# Patient Record
Sex: Female | Born: 1980 | Hispanic: Yes | Smoking: Current some day smoker
Health system: Southern US, Community
[De-identification: ages and names within clinical notes are randomized; demographics above are authoritative.]

## PROBLEM LIST (undated history)

## (undated) DIAGNOSIS — D219 Benign neoplasm of connective and other soft tissue, unspecified: Secondary | ICD-10-CM

## (undated) DIAGNOSIS — I10 Essential (primary) hypertension: Secondary | ICD-10-CM

## (undated) DIAGNOSIS — M722 Plantar fascial fibromatosis: Secondary | ICD-10-CM

## (undated) DIAGNOSIS — J45909 Unspecified asthma, uncomplicated: Secondary | ICD-10-CM

## (undated) DIAGNOSIS — E0590 Thyrotoxicosis, unspecified without thyrotoxic crisis or storm: Secondary | ICD-10-CM

## (undated) DIAGNOSIS — F419 Anxiety disorder, unspecified: Secondary | ICD-10-CM

## (undated) DIAGNOSIS — M7730 Calcaneal spur, unspecified foot: Secondary | ICD-10-CM

## (undated) DIAGNOSIS — K219 Gastro-esophageal reflux disease without esophagitis: Secondary | ICD-10-CM

## (undated) DIAGNOSIS — F32A Depression, unspecified: Secondary | ICD-10-CM

## (undated) DIAGNOSIS — G709 Myoneural disorder, unspecified: Secondary | ICD-10-CM

## (undated) HISTORY — PX: MOUTH SURGERY: SHX715

---

## 2003-05-01 ENCOUNTER — Other Ambulatory Visit: Admission: RE | Admit: 2003-05-01 | Discharge: 2003-05-01 | Payer: Self-pay | Admitting: Obstetrics and Gynecology

## 2005-07-23 ENCOUNTER — Encounter: Admission: RE | Admit: 2005-07-23 | Discharge: 2005-07-23 | Payer: Self-pay | Admitting: Family Medicine

## 2006-05-06 ENCOUNTER — Emergency Department (HOSPITAL_COMMUNITY): Admission: EM | Admit: 2006-05-06 | Discharge: 2006-05-06 | Payer: Self-pay | Admitting: Family Medicine

## 2006-12-20 ENCOUNTER — Emergency Department (HOSPITAL_COMMUNITY): Admission: EM | Admit: 2006-12-20 | Discharge: 2006-12-20 | Payer: Self-pay | Admitting: Emergency Medicine

## 2006-12-23 ENCOUNTER — Emergency Department (HOSPITAL_COMMUNITY): Admission: EM | Admit: 2006-12-23 | Discharge: 2006-12-23 | Payer: Self-pay | Admitting: Emergency Medicine

## 2007-05-13 ENCOUNTER — Emergency Department (HOSPITAL_COMMUNITY): Admission: EM | Admit: 2007-05-13 | Discharge: 2007-05-13 | Payer: Self-pay | Admitting: Family Medicine

## 2007-12-19 ENCOUNTER — Emergency Department (HOSPITAL_COMMUNITY): Admission: EM | Admit: 2007-12-19 | Discharge: 2007-12-20 | Payer: Self-pay | Admitting: *Deleted

## 2007-12-31 ENCOUNTER — Emergency Department (HOSPITAL_COMMUNITY): Admission: EM | Admit: 2007-12-31 | Discharge: 2007-12-31 | Payer: Self-pay | Admitting: Emergency Medicine

## 2010-08-22 ENCOUNTER — Encounter: Payer: Self-pay | Admitting: Family Medicine

## 2011-04-27 LAB — D-DIMER, QUANTITATIVE: D-Dimer, Quant: 0.24

## 2011-04-27 LAB — DIFFERENTIAL
Lymphocytes Relative: 25
Lymphs Abs: 1.6
Neutro Abs: 4.6
Neutrophils Relative %: 70

## 2011-04-27 LAB — POCT I-STAT, CHEM 8
BUN: 5 — ABNORMAL LOW
Chloride: 106
Sodium: 140

## 2011-04-27 LAB — CBC
Platelets: 261
WBC: 6.6

## 2011-04-28 LAB — POCT PREGNANCY, URINE
Operator id: 133441
Preg Test, Ur: NEGATIVE

## 2013-02-21 ENCOUNTER — Encounter: Payer: Self-pay | Admitting: Family Medicine

## 2013-02-21 ENCOUNTER — Ambulatory Visit: Payer: Self-pay | Attending: Family Medicine | Admitting: Family Medicine

## 2013-02-21 VITALS — BP 127/90 | HR 96 | Temp 97.7°F | Resp 16

## 2013-02-21 DIAGNOSIS — G8929 Other chronic pain: Secondary | ICD-10-CM | POA: Insufficient documentation

## 2013-02-21 DIAGNOSIS — M5430 Sciatica, unspecified side: Secondary | ICD-10-CM

## 2013-02-21 DIAGNOSIS — R5383 Other fatigue: Secondary | ICD-10-CM

## 2013-02-21 DIAGNOSIS — M25559 Pain in unspecified hip: Secondary | ICD-10-CM | POA: Insufficient documentation

## 2013-02-21 DIAGNOSIS — R631 Polydipsia: Secondary | ICD-10-CM

## 2013-02-21 DIAGNOSIS — R5381 Other malaise: Secondary | ICD-10-CM

## 2013-02-21 DIAGNOSIS — M5440 Lumbago with sciatica, unspecified side: Secondary | ICD-10-CM | POA: Insufficient documentation

## 2013-02-21 LAB — COMPLETE METABOLIC PANEL WITH GFR
ALT: 24 U/L (ref 0–35)
AST: 26 U/L (ref 0–37)
CO2: 24 mEq/L (ref 19–32)
GFR, Est African American: >89 mL/min
Sodium: 133 mEq/L — ABNORMAL LOW (ref 135–145)
Total Bilirubin: 0.5 mg/dL (ref 0.3–1.2)
Total Protein: 7.9 g/dL (ref 6.0–8.3)

## 2013-02-21 LAB — VITAMIN B12: Vitamin B-12: 807 pg/mL (ref 211–911)

## 2013-02-21 LAB — CBC WITH DIFFERENTIAL/PLATELET
Basophils Absolute: 0 10*3/uL (ref 0.0–0.1)
Eosinophils Absolute: 0.1 10*3/uL (ref 0.0–0.7)
Lymphocytes Relative: 35 % (ref 12–46)
Lymphs Abs: 1.8 10*3/uL (ref 0.7–4.0)
MCH: 31.5 pg (ref 26.0–34.0)
Neutrophils Relative %: 53 % (ref 43–77)
Platelets: 335 10*3/uL (ref 150–400)
RBC: 4.48 MIL/uL (ref 3.87–5.11)
RDW: 13 % (ref 11.5–15.5)
WBC: 5.1 10*3/uL (ref 4.0–10.5)

## 2013-02-21 MED ORDER — PREDNISONE 20 MG PO TABS: ORAL_TABLET | ORAL | Status: DC

## 2013-02-21 MED ORDER — TRAMADOL HCL 50 MG PO TABS: 50.0000 mg | ORAL_TABLET | Freq: Three times a day (TID) | ORAL | Status: DC | PRN

## 2013-02-21 NOTE — Patient Instructions (Addendum)
Back Injury Prevention Back injuries can be extremely painful and difficult to heal. After having one back injury, you are much more likely to experience another later on. It is important to learn how to avoid injuring or re-injuring your back. The following tips can help you to prevent a back injury. PHYSICAL FITNESS  Exercise regularly and try to develop good tone in your abdominal muscles. Your abdominal muscles provide a lot of the support needed by your back.  Do aerobic exercises (walking, jogging, biking, swimming) regularly.  Do exercises that increase balance and strength (tai chi, yoga) regularly. This can decrease your risk of falling and injuring your back.  Stretch before and after exercising.  Maintain a healthy weight. The more you weigh, the more stress is placed on your back. For every pound of weight, 10 times that amount of pressure is placed on the back. DIET  Talk to your caregiver about how much calcium and vitamin D you need per day. These nutrients help to prevent weakening of the bones (osteoporosis). Osteoporosis can cause broken (fractured) bones that lead to back pain.  Include good sources of calcium in your diet, such as dairy products, green, leafy vegetables, and products with calcium added (fortified).  Include good sources of vitamin D in your diet, such as milk and foods that are fortified with vitamin D.  Consider taking a nutritional supplement or a multivitamin if needed.  Stop smoking if you smoke. POSTURE  Sit and stand up straight. Avoid leaning forward when you sit or hunching over when you stand.  Choose chairs with good low back (lumbar) support.  If you work at a desk, sit close to your work so you do not need to lean over. Keep your chin tucked in. Keep your neck drawn back and elbows bent at a right angle. Your arms should look like the letter "L."  Sit high and close to the steering wheel when you drive. Add a lumbar support to your car  seat if needed.  Avoid sitting or standing in one position for too long. Take breaks to get up, stretch, and walk around at least once every hour. Take breaks if you are driving for long periods of time.  Sleep on your side with your knees slightly bent, or sleep on your back with a pillow under your knees. Do not sleep on your stomach. LIFTING, TWISTING, AND REACHING  Avoid heavy lifting, especially repetitive lifting. If you must do heavy lifting:  Stretch before lifting.  Work slowly.  Rest between lifts.  Use carts and dollies to move objects when possible.  Make several small trips instead of carrying 1 heavy load.  Ask for help when you need it.  Ask for help when moving big, awkward objects.  Follow these steps when lifting:  Stand with your feet shoulder-width apart.  Get as close to the object as you can. Do not try to pick up heavy objects that are far from your body.  Use handles or lifting straps if they are available.  Bend at your knees. Squat down, but keep your heels off the floor.  Keep your shoulders pulled back, your chin tucked in, and your back straight.  Lift the object slowly, tightening the muscles in your legs, abdomen, and buttocks. Keep the object as close to the center of your body as possible.  When you put a load down, use these same guidelines in reverse.  Do not:  Lift the object above your waist.    Twist at the waist while lifting or carrying a load. Move your feet if you need to turn, not your waist.  Bend over without bending at your knees.  Avoid reaching over your head, across a table, or for an object on a high surface. OTHER TIPS  Avoid wet floors and keep sidewalks clear of ice to prevent falls.  Do not sleep on a mattress that is too soft or too hard.  Keep items that are used frequently within easy reach.  Put heavier objects on shelves at waist level and lighter objects on lower or higher shelves.  Find ways to  decrease your stress, such as exercise, massage, or relaxation techniques. Stress can build up in your muscles. Tense muscles are more vulnerable to injury.  Seek treatment for depression or anxiety if needed. These conditions can increase your risk of developing back pain. SEEK MEDICAL CARE IF:  You injure your back.  You have questions about diet, exercise, or other ways to prevent back injuries. MAKE SURE YOU:  Understand these instructions.  Will watch your condition.  Will get help right away if you are not doing well or get worse. Document Released: 08/25/2004 Document Revised: 10/10/2011 Document Reviewed: 08/29/2011 [facility] Patient Information 2014 [place], [place]. Back Pain, Adult Low back pain is very common. About 1 in 5 people have back pain.The cause of low back pain is rarely dangerous. The pain often gets better over time.About half of people with a sudden onset of back pain feel better in just 2 weeks. About 8 in 10 people feel better by 6 weeks.  CAUSES Some common causes of back pain include:  Strain of the muscles or ligaments supporting the spine.  Wear and tear (degeneration) of the spinal discs.  Arthritis.  Direct injury to the back. DIAGNOSIS Most of the time, the direct cause of low back pain is not known.However, back pain can be treated effectively even when the exact cause of the pain is unknown.Answering your caregiver's questions about your overall health and symptoms is one of the most accurate ways to make sure the cause of your pain is not dangerous. If your caregiver needs more information, he or she may order lab work or imaging tests (X-rays or MRIs).However, even if imaging tests show changes in your back, this usually does not require surgery. HOME CARE INSTRUCTIONS For many people, back pain returns.Since low back pain is rarely dangerous, it is often a condition that people can learn to [facility] their own.   Remain active. It is  stressful on the back to sit or stand in one place. Do not sit, drive, or stand in one place for more than 30 minutes at a time. Take short walks on level surfaces as soon as pain allows.Try to increase the length of time you walk each day.  Do not stay in bed.Resting more than 1 or 2 days can delay your recovery.  Do not avoid exercise or work.Your body is made to move.It is not dangerous to be active, even though your back may hurt.Your back will likely heal faster if you return to being active before your pain is gone.  Pay attention to your body when you bend and lift. Many people have less discomfortwhen lifting if they bend their knees, keep the load close to their bodies,and avoid twisting. Often, the most comfortable positions are those that put less stress on your recovering back.  Find a comfortable position to sleep. Use a firm mattress and lie  on your side with your knees slightly bent. If you lie on your back, put a pillow under your knees.  Only take over-the-counter or prescription medicines as directed by your caregiver. Over-the-counter medicines to reduce pain and inflammation are often the most helpful.Your caregiver may prescribe muscle relaxant drugs.These medicines help dull your pain so you can more quickly return to your normal activities and healthy exercise.  Put ice on the injured area.  Put ice in a plastic bag.  Place a towel between your skin and the bag.  Leave the ice on for 15-20 minutes, 3-4 times a day for the first 2 to 3 days. After that, ice and heat may be alternated to reduce pain and spasms.  Ask your caregiver about trying back exercises and gentle massage. This may be of some benefit.  Avoid feeling anxious or stressed.Stress increases muscle tension and can worsen back pain.It is important to recognize when you are anxious or stressed and learn ways to manage it.Exercise is a great option. SEEK MEDICAL CARE IF:  You have pain that is  not relieved with rest or medicine.  You have pain that does not improve in 1 week.  You have new symptoms.  You are generally not feeling well. SEEK IMMEDIATE MEDICAL CARE IF:   You have pain that radiates from your back into your legs.  You develop new bowel or bladder control problems.  You have unusual weakness or numbness in your arms or legs.  You develop nausea or vomiting.  You develop abdominal pain.  You feel faint. Document Released: 07/18/2005 Document Revised: 01/17/2012 Document Reviewed: 12/06/2010 [facility] Patient Information 2014 [place], [place]. Fatigue Fatigue is a feeling of tiredness, lack of energy, lack of motivation, or feeling tired all the time. Having enough rest, good nutrition, and reducing stress will normally reduce fatigue. Consult your caregiver if it persists. The nature of your fatigue will help your caregiver to find out its cause. The treatment is based on the cause.  CAUSES  There are many causes for fatigue. Most of the time, fatigue can be traced to one or more of your habits or routines. Most causes fit into one or more of three general areas. They are: Lifestyle problems  Sleep disturbances.  Overwork.  Physical exertion.  Unhealthy habits.  Poor eating habits or eating disorders.  Alcohol and/or drug use .  Lack of proper nutrition (malnutrition). Psychological problems  Stress and/or anxiety problems.  Depression.  Grief.  Boredom. Medical Problems or Conditions  Anemia.  Pregnancy.  Thyroid gland problems.  Recovery from major surgery.  Continuous pain.  Emphysema or asthma that is not well controlled  Allergic conditions.  Diabetes.  Infections (such as mononucleosis).  Obesity.  Sleep disorders, such as sleep apnea.  Heart failure or other heart-related problems.  Cancer.  Kidney disease.  Liver disease.  Effects of certain medicines such as antihistamines, cough and cold remedies,  prescription pain medicines, heart and blood pressure medicines, drugs used for treatment of cancer, and some antidepressants. SYMPTOMS  The symptoms of fatigue include:   Lack of energy.  Lack of drive (motivation).  Drowsiness.  Feeling of indifference to the surroundings. DIAGNOSIS  The details of how you feel help guide your caregiver in finding out what is causing the fatigue. You will be asked about your present and past health condition. It is important to review all medicines that you take, including prescription and non-prescription items. A thorough exam will be done. You will be  questioned about your feelings, habits, and normal lifestyle. Your caregiver may suggest blood tests, urine tests, or other tests to look for common medical causes of fatigue.  TREATMENT  Fatigue is treated by correcting the underlying cause. For example, if you have continuous pain or depression, treating these causes will improve how you feel. Similarly, adjusting the dose of certain medicines will help in reducing fatigue.  HOME CARE INSTRUCTIONS   Try to get the required amount of good sleep every night.  Eat a healthy and nutritious diet, and drink enough water throughout the day.  Practice ways of relaxing (including yoga or meditation).  Exercise regularly.  Make plans to change situations that cause stress. Act on those plans so that stresses decrease over time. Keep your work and personal routine reasonable.  Avoid street drugs and minimize use of alcohol.  Start taking a daily multivitamin after consulting your caregiver. SEEK MEDICAL CARE IF:   You have persistent tiredness, which cannot be accounted for.  You have fever.  You have unintentional weight loss.  You have headaches.  You have disturbed sleep throughout the night.  You are feeling sad.  You have constipation.  You have dry skin.  You have gained weight.  You are taking any new or different medicines that you  suspect are causing fatigue.  You are unable to sleep at night.  You develop any unusual swelling of your legs or other parts of your body. SEEK IMMEDIATE MEDICAL CARE IF:   You are feeling confused.  Your vision is blurred.  You feel faint or pass out.  You develop severe headache.  You develop severe abdominal, pelvic, or back pain.  You develop chest pain, shortness of breath, or an irregular or fast heartbeat.  You are unable to pass a normal amount of urine.  You develop abnormal bleeding such as bleeding from the rectum or you vomit blood.  You have thoughts about harming yourself or committing suicide.  You are worried that you might harm someone else. MAKE SURE YOU:   Understand these instructions.  Will watch your condition.  Will get help right away if you are not doing well or get worse. Document Released: 05/15/2007 Document Revised: 10/10/2011 Document Reviewed: 05/15/2007 [facility] Patient Information 2014 [place], [place].

## 2013-02-21 NOTE — Progress Notes (Signed)
Patient here for right hip pain Has been feeling fatuqed Increase thirst

## 2013-02-21 NOTE — Progress Notes (Signed)
Patient ID: Alyssa Sullivan, female   DOB: May 27, 1981, 32 y.o.   MRN: [id number]  CC:  Chief Complaint  Patient presents with  . Hip Pain    right   HPI: Pt reports having chronic right hip pain.  She says that she was told years ago that it was coming from lower back.  Pain involves right hip and occasionally shoots down to right leg.  Pain has gotten worse over past few weeks.  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  Pt says no injury associated with pain. No fall and no trauma that she can remember.  Pt says that she is getting concerned about it now because of worsening symptoms.  Pt says that she has not had any imaging studies done up to this time.  No loss of bowel or bladder control.  No rash.  Pt reports that she has been having chronic fatigue for past several months and is really concerned about having it evaluated.   Allergies  Allergen Reactions  . Codeine   . Penicillins    No past medical history on file. No current outpatient prescriptions on file prior to visit.   No current facility-administered medications on file prior to visit.   No family history on file. History   Social History  . Marital Status: Single    Spouse Name: N/A    Number of Children: 0  . Years of Education: 24   Occupational History  . waitress    Social History Main Topics  . Smoking status: Current Every Day Smoker    Types: Cigarettes  . Smokeless tobacco: Not on file  . Alcohol Use: 0.0 oz/week  . Drug Use: No  . Sexually Active: Not on file   Other Topics Concern  . Not on file   Social History Narrative  . No narrative on file    Review of Systems  Constitutional: Positive for chronic fatigue. Negative for fever, chills, diaphoresis, activity change, appetite change.  HENT: Negative for ear pain, nosebleeds, congestion, facial swelling, rhinorrhea, neck pain, neck stiffness and ear discharge.   Eyes: Negative for pain, discharge, redness, itching  and visual disturbance.  Respiratory: Negative for cough, choking, chest tightness, shortness of breath, wheezing and stridor.   Cardiovascular: Negative for chest pain, palpitations and leg swelling.  Gastrointestinal: Negative for abdominal distention.  Genitourinary: Negative for dysuria, urgency, frequency, hematuria, flank pain, decreased urine volume, difficulty urinating and dyspareunia.  Musculoskeletal: chronic right hip joint pain.  Neurological: Negative for dizziness, tremors, seizures, syncope, facial asymmetry, speech difficulty, weakness, light-headedness, numbness and headaches.  Hematological: Negative for adenopathy. Does not bruise/bleed easily.  Psychiatric/Behavioral: Negative for hallucinations, behavioral problems, confusion, dysphoric mood, decreased concentration and agitation.    Objective:   Filed Vitals:   02/21/13 1522  BP: 127/90  Pulse: 96  Temp: 97.7 F (36.5 C)  Resp: 16    Physical Exam  Constitutional: Appears well-developed and well-nourished. No distress.  HENT: Normocephalic. External right and left ear normal. Oropharynx is clear and moist.  Eyes: Conjunctivae and EOM are normal. PERRLA, no scleral icterus.  Neck: Normal ROM. Neck supple. No JVD. No tracheal deviation. No thyromegaly.  CVS: RRR, S1/S2 +, no murmurs, no gallops, no carotid bruit.  Pulmonary: Effort and breath sounds normal, no stridor, rhonchi, wheezes, rales.  Abdominal: Soft. BS +,  no distension, tenderness, rebound or guarding.  Musculoskeletal: significant pain in right hip joint with abduction of right  leg and internal rotation of hip joint.  Lymphadenopathy: No lymphadenopathy noted, cervical, inguinal. Neuro: Alert. Normal reflexes, muscle tone coordination. No cranial nerve deficit. Skin: Skin is warm and dry. No rash noted. Not diaphoretic. No erythema. No pallor.  Psychiatric: Normal mood and affect. Behavior, judgment, thought content normal.   Lab Results   Component Value Date   WBC 6.6 12/19/2007   HGB 16.0* 12/19/2007   HCT 47.0* 12/19/2007   MCV 95.1 12/19/2007   PLT 261 12/19/2007   Lab Results  Component Value Date   CREATININE 1.0 12/19/2007   BUN 5* 12/19/2007   NA 140 12/19/2007   K 4.6 12/19/2007   CL 106 12/19/2007    No results found for this basename: HGBA1C   Lipid Panel  No results found for this basename: chol, trig, hdl, cholhdl, vldl, ldlcalc       Assessment and plan:   Patient Active Problem List   Diagnosis Date Noted  . Chronic hip pain 02/21/2013  . Low back pain with sciatica 02/21/2013   Check xrays of lumbar spine and right hip - sent to [facility].   Refer to sports medicine for evaluation and mgmt  Rx for prednisone 50 mg po daily #5  Tramadol 50 mg po every 8 hours prn severe pain, #25  Follow up in 3 weeks  For fatigue, check labs today  The patient was counseled on the dangers of tobacco use, and was advised to quit.  Reviewed strategies to maximize success, including removing cigarettes and smoking materials from environment.   The patient was given clear instructions to go to ER or return to medical center if symptoms don't improve, worsen or new problems develop.  The patient verbalized understanding.  The patient was told to call to get any lab results if not heard anything in the next week.    Rodney Langton, MD, CDE, FAAFP [facility] [place], [place]   Results for orders placed in visit on 02/21/13  GLUCOSE, POCT (MANUAL RESULT ENTRY)      Result Value Range   POC Glucose 103 (*) 70 - 99 mg/dl

## 2013-02-22 ENCOUNTER — Telehealth: Payer: Self-pay | Admitting: *Deleted

## 2013-02-22 LAB — VITAMIN D 25 HYDROXY (VIT D DEFICIENCY, FRACTURES): Vit D, 25-Hydroxy: 21 ng/mL — ABNORMAL LOW (ref 30–89)

## 2013-02-22 NOTE — Telephone Encounter (Signed)
02/22/13 Patient made aware of lab results vitamin D mildly low  Recommend over the counter Vitamin D 400-800 daily. Thyroid and metabolic panel  Okay. P.Demani Mcbrien,RN BSN [facility]

## 2013-02-22 NOTE — Progress Notes (Signed)
Quick Note:  Please inform patient that labs came back OK except that Vit D came back mildy low. Recommend patient take over the counter vitamin D 400-800 IU caps po daily. Metabolic panel, thyroid, B12 and blood counts came back normal.   Rodney Langton, MD, CDE, FAAFP [facility] [place], [place]   ______

## 2013-02-27 ENCOUNTER — Ambulatory Visit: Payer: Self-pay

## 2013-03-14 ENCOUNTER — Ambulatory Visit: Payer: Self-pay

## 2013-03-20 ENCOUNTER — Ambulatory Visit: Payer: Self-pay | Admitting: Sports Medicine

## 2014-03-13 ENCOUNTER — Emergency Department (INDEPENDENT_AMBULATORY_CARE_PROVIDER_SITE_OTHER)
Admission: EM | Admit: 2014-03-13 | Discharge: 2014-03-13 | Disposition: A | Discharge status: Home / Self Care | Payer: Self-pay | Source: Home / Self Care | Attending: Emergency Medicine | Admitting: Emergency Medicine

## 2014-03-13 ENCOUNTER — Encounter (HOSPITAL_COMMUNITY): Payer: Self-pay | Admitting: Emergency Medicine

## 2014-03-13 DIAGNOSIS — J020 Streptococcal pharyngitis: Secondary | ICD-10-CM

## 2014-03-13 LAB — POCT RAPID STREP A: Streptococcus, Group A Screen (Direct): POSITIVE — AB

## 2014-03-13 MED ORDER — AZITHROMYCIN 500 MG PO TABS: 500.0000 mg | ORAL_TABLET | Freq: Every day | ORAL | Status: DC

## 2014-03-13 MED ORDER — METHYLPREDNISOLONE ACETATE 80 MG/ML IJ SUSP
INTRAMUSCULAR | Status: AC
  Filled 2014-03-13: qty 1

## 2014-03-13 MED ORDER — PREDNISONE 20 MG PO TABS: 20.0000 mg | ORAL_TABLET | Freq: Two times a day (BID) | ORAL | Status: DC

## 2014-03-13 MED ORDER — METHYLPREDNISOLONE ACETATE 80 MG/ML IJ SUSP
80.0000 mg | Freq: Once | INTRAMUSCULAR | Status: AC
  Administered 2014-03-13: 80 mg via INTRAMUSCULAR

## 2014-03-13 NOTE — Discharge Instructions (Signed)

## 2014-03-13 NOTE — ED Notes (Signed)
C/o sore throat  States she has laryngitis and is unable to talk OTC meds used as tx

## 2014-03-13 NOTE — ED Provider Notes (Signed)
Chief Complaint   Chief Complaint  Patient presents with  . Sore Throat    History of Present Illness   Alyssa Sullivan is a 33 year old female who has had a four-day history of sore throat, pain on swallowing, chills, headache, aching in her neck, pain on the entire right side of her head including her eye in her ear, abdominal pain, nausea, and vomiting.   Review of Systems   Other than as noted above, the patient denies any of the following symptoms. Systemic:  No fever, chills, sweats, myalgias, or headache. Eye:  No redness, pain or drainage. ENT:  No earache, nasal congestion, sneezing, rhinorrhea, sinus pressure, sinus pain, or post nasal drip. Lungs:  No cough, sputum production, wheezing, shortness of breath, or chest pain. GI:  No abdominal pain, nausea, vomiting, or diarrhea. Skin:  No rash.  [place]   Past medical history, family history, social history, meds, and allergies were reviewed. She's allergic to penicillin and codeine.  Physical Exam     Vital signs:  BP 127/87  Pulse 86  Temp(Src) 98.4 F (36.9 C) (Oral)  Resp 16  SpO2 99%  LMP 02/20/2014 General:  Alert, in no distress. Phonation was normal, no drooling, and patient was able to handle secretions well.  Eye:  No conjunctival injection or drainage. Lids were normal. ENT:  TMs and canals were normal, without erythema or inflammation.  Nasal mucosa was clear and uncongested, without drainage.  Mucous membranes were moist.  Exam of pharynx tonsils were symmetrically enlarged and erythematous without any exudate.  There were no oral ulcerations or lesions. There was no bulging of the tonsillar pillars, and the uvula was midline. Neck:  Supple, no adenopathy, tenderness or mass. Lungs:  No respiratory distress.  Lungs were clear to auscultation, without wheezes, rales or rhonchi.  Breath sounds were clear and equal bilaterally.  Heart:  Regular rhythm, without gallops, murmers or rubs. Skin:  Clear,  warm, and dry, without rash or lesions.  Labs   Results for orders placed during the hospital encounter of 03/13/14  POCT RAPID STREP A ([facility] URG CARE ONLY)      Result Value Ref Range   Streptococcus, Group A Screen (Direct) POSITIVE (*) NEGATIVE    Course in Urgent [place]   The patient was given the following meds: Medications  methylPREDNISolone acetate (DEPO-MEDROL) injection 80 mg (80 mg Intramuscular Given 03/13/14 1440)   Assessment   The encounter diagnosis was Strep throat.  There is no evidence of a peritonsillar abscess, retropharyngeal abscess, or epiglottitis.    Plan     1.  Meds:  The following meds were prescribed:   Discharge Medication List as of 03/13/2014  2:21 PM    START taking these medications   Details  azithromycin (ZITHROMAX) 500 MG tablet Take 1 tablet (500 mg total) by mouth daily., Starting 03/13/2014, Until Discontinued, Normal    !! predniSONE (DELTASONE) 20 MG tablet Take 1 tablet (20 mg total) by mouth 2 (two) times daily., Starting 03/13/2014, Until Discontinued, Normal     !! - Potential duplicate medications found. Please discuss with provider.      2.  Patient Education/Counseling:  The patient was given appropriate handouts, self care instructions, and instructed in symptomatic relief, including hot saline gargles, throat lozenges, infectious precautions, and need to trade out toothbrush.    3.  Follow up:  The patient was told to follow up here if no better in 3 to 4 days, or sooner if  becoming worse in any way, and given some red flag symptoms such as difficulty swallowing or breathing which would prompt immediate return.      Harden Mo, MD 03/13/14 [phone]

## 2014-05-27 ENCOUNTER — Emergency Department (INDEPENDENT_AMBULATORY_CARE_PROVIDER_SITE_OTHER)
Admission: EM | Admit: 2014-05-27 | Discharge: 2014-05-27 | Disposition: A | Discharge status: Home / Self Care | Payer: Self-pay | Source: Home / Self Care | Attending: Family Medicine | Admitting: Family Medicine

## 2014-05-27 ENCOUNTER — Encounter (HOSPITAL_COMMUNITY): Payer: Self-pay | Admitting: Emergency Medicine

## 2014-05-27 DIAGNOSIS — F17200 Nicotine dependence, unspecified, uncomplicated: Secondary | ICD-10-CM

## 2014-05-27 DIAGNOSIS — Z720 Tobacco use: Secondary | ICD-10-CM

## 2014-05-27 DIAGNOSIS — J40 Bronchitis, not specified as acute or chronic: Secondary | ICD-10-CM

## 2014-05-27 MED ORDER — PREDNISONE 50 MG PO TABS: 50.0000 mg | ORAL_TABLET | Freq: Every day | ORAL | Status: DC

## 2014-05-27 MED ORDER — IPRATROPIUM-ALBUTEROL 0.5-2.5 (3) MG/3ML IN SOLN
RESPIRATORY_TRACT | Status: AC
  Filled 2014-05-27: qty 3

## 2014-05-27 MED ORDER — ALBUTEROL SULFATE HFA 108 (90 BASE) MCG/ACT IN AERS
INHALATION_SPRAY | RESPIRATORY_TRACT | Status: AC
  Filled 2014-05-27: qty 6.7

## 2014-05-27 MED ORDER — ALBUTEROL SULFATE HFA 108 (90 BASE) MCG/ACT IN AERS
2.0000 | INHALATION_SPRAY | Freq: Four times a day (QID) | RESPIRATORY_TRACT | Status: DC | PRN
  Administered 2014-05-27: 2 via RESPIRATORY_TRACT

## 2014-05-27 MED ORDER — IPRATROPIUM-ALBUTEROL 0.5-2.5 (3) MG/3ML IN SOLN
3.0000 mL | Freq: Once | RESPIRATORY_TRACT | Status: AC
  Administered 2014-05-27: 3 mL via RESPIRATORY_TRACT

## 2014-05-27 NOTE — ED Notes (Signed)
C/o  Chest tightness and chest congestion.  Productive cough with thick yellow/green sputum.  Wheezing.  Sob.  Symptoms worse at night.  No fever, n/v.  Mild loose stools.  Symptoms present x 2 wks.    No relief with otc meds.

## 2014-05-27 NOTE — ED Provider Notes (Signed)
Alyssa Sullivan is a 33 y.o. female who presents to Urgent Care today for shortness of breath productive cough and wheezing. Symptoms present for about 2 weeks. No fevers or chills nausea vomiting or diarrhea. Patient is a current daily smoker. She has tried over-the-counter medications which have not helped.   History reviewed. No pertinent past medical history. History  Substance Use Topics  . Smoking status: Current Every Day Smoker    Types: Cigarettes  . Smokeless tobacco: Not on file  . Alcohol Use: 0.0 oz/week   ROS as above Medications: Current Facility-Administered Medications  Medication Dose Route Frequency Provider Last Rate Last Dose  . albuterol (PROVENTIL HFA;VENTOLIN HFA) 108 (90 BASE) MCG/ACT inhaler 2 puff  2 puff Inhalation Q6H PRN Gregor Hams, MD   2 puff at 05/27/14 1656   Current Outpatient Prescriptions  Medication Sig Dispense Refill  . predniSONE (DELTASONE) 50 MG tablet Take 1 tablet (50 mg total) by mouth daily.  5 tablet  0  . VITAMIN D, CHOLECALCIFEROL, PO Take by mouth.        Exam:  BP 130/81  Pulse 105  Temp(Src) 98.2 F (36.8 C) (Oral)  Resp 16  SpO2 97%  LMP 05/18/2014 Gen: Well NAD HEENT: EOMI,  MMM Lungs: Normal work of breathing. Prolonged expiratory phase with wheezing and coarse breath sounds present bilaterally. Heart: RRR no MRG Abd: NABS, Soft. Nondistended, Nontender Exts: Brisk capillary refill, warm and well perfused.   She was given a 2.5/0.5 mg DuoNeb nebulizer treatment, and felt better  No results found for this or any previous visit (from the past 24 hour(s)). No results found.  Assessment and Plan: 33 y.o. female with bronchitis likely related to smoking. Possible underlying COPD. We do treat with albuterol and prednisone. Follow up with primary care is needed. Quit smoking.  Discussed warning signs or symptoms. Please see discharge instructions. Patient expresses understanding.     Gregor Hams, MD 05/27/14  [phone]

## 2014-05-27 NOTE — Discharge Instructions (Signed)
Thank you for coming in today. Take prednisone daily for 5 days. Use albuterol as needed for wheezing. Please quit smoking Call or go to the emergency room if you get worse, have trouble breathing, have chest pains, or palpitations.    Chronic Obstructive Pulmonary Disease Chronic obstructive pulmonary disease (COPD) is a common lung condition in which airflow from the lungs is limited. COPD is a general term that can be used to describe many different lung problems that limit airflow, including both chronic bronchitis and emphysema. If you have COPD, your lung function will probably never return to normal, but there are measures you can take to improve lung function and make yourself feel better.  CAUSES   Smoking (common).   Exposure to secondhand smoke.   Genetic problems.  Chronic inflammatory lung diseases or recurrent infections. SYMPTOMS   Shortness of breath, especially with physical activity.   Deep, persistent (chronic) cough with a large amount of thick mucus.   Wheezing.   Rapid breaths (tachypnea).   Gray or bluish discoloration (cyanosis) of the skin, especially in fingers, toes, or lips.   Fatigue.   Weight loss.   Frequent infections or episodes when breathing symptoms become much worse (exacerbations).   Chest tightness. DIAGNOSIS  Your health care provider will take a medical history and perform a physical examination to make the initial diagnosis. Additional tests for COPD may include:   Lung (pulmonary) function tests.  Chest X-ray.  CT scan.  Blood tests. TREATMENT  Treatment available to help you feel better when you have COPD includes:   Inhaler and nebulizer medicines. These help manage the symptoms of COPD and make your breathing more comfortable.  Supplemental oxygen. Supplemental oxygen is only helpful if you have a low oxygen level in your blood.   Exercise and physical activity. These are beneficial for nearly all people  with COPD. Some people may also benefit from a pulmonary rehabilitation program. HOME CARE INSTRUCTIONS   Take all medicines (inhaled or pills) as directed by your health care provider.  Avoid over-the-counter medicines or cough syrups that dry up your airway (such as antihistamines) and slow down the elimination of secretions unless instructed otherwise by your health care provider.   If you are a smoker, the most important thing that you can do is stop smoking. Continuing to smoke will cause further lung damage and breathing trouble. Ask your health care provider for help with quitting smoking. He or she can direct you to community resources or hospitals that provide support.  Avoid exposure to irritants such as smoke, chemicals, and fumes that aggravate your breathing.  Use oxygen therapy and pulmonary rehabilitation if directed by your health care provider. If you require home oxygen therapy, ask your health care provider whether you should purchase a pulse oximeter to measure your oxygen level at home.   Avoid contact with individuals who have a contagious illness.  Avoid extreme temperature and humidity changes.  Eat healthy foods. Eating smaller, more frequent meals and resting before meals may help you maintain your strength.  Stay active, but balance activity with periods of rest. Exercise and physical activity will help you maintain your ability to do things you want to do.  Preventing infection and hospitalization is very important when you have COPD. Make sure to receive all the vaccines your health care provider recommends, especially the pneumococcal and influenza vaccines. Ask your health care provider whether you need a pneumonia vaccine.  Learn and use relaxation techniques to manage  stress.  Learn and use controlled breathing techniques as directed by your health care provider. Controlled breathing techniques include:   Pursed lip breathing. Start by breathing in  (inhaling) through your nose for 1 second. Then, purse your lips as if you were going to whistle and breathe out (exhale) through the pursed lips for 2 seconds.   Diaphragmatic breathing. Start by putting one hand on your abdomen just above your waist. Inhale slowly through your nose. The hand on your abdomen should move out. Then purse your lips and exhale slowly. You should be able to feel the hand on your abdomen moving in as you exhale.   Learn and use controlled coughing to clear mucus from your lungs. Controlled coughing is a series of short, progressive coughs. The steps of controlled coughing are:  1. Lean your head slightly forward.  2. Breathe in deeply using diaphragmatic breathing.  3. Try to hold your breath for 3 seconds.  4. Keep your mouth slightly open while coughing twice.  5. Spit any mucus out into a tissue.  6. Rest and repeat the steps once or twice as needed. SEEK MEDICAL CARE IF:   You are coughing up more mucus than usual.   There is a change in the color or thickness of your mucus.   Your breathing is more labored than usual.   Your breathing is faster than usual.  SEEK IMMEDIATE MEDICAL CARE IF:   You have shortness of breath while you are resting.   You have shortness of breath that prevents you from:  Being able to talk.   Performing your usual physical activities.   You have chest pain lasting longer than 5 minutes.   Your skin color is more cyanotic than usual.  You measure low oxygen saturations for longer than 5 minutes with a pulse oximeter. MAKE SURE YOU:   Understand these instructions.  Will watch your condition.  Will get help right away if you are not doing well or get worse. Document Released: 04/27/2005 Document Revised: 12/02/2013 Document Reviewed: 03/14/2013 [facility] Patient Information 2015 [place], [place]. This information is not intended to replace advice given to you by your health care provider. Make sure you  discuss any questions you have with your health care provider.

## 2014-05-29 ENCOUNTER — Telehealth (HOSPITAL_COMMUNITY): Payer: Self-pay | Admitting: Family Medicine

## 2014-05-29 MED ORDER — TRAMADOL HCL 50 MG PO TABS: 50.0000 mg | ORAL_TABLET | Freq: Every evening | ORAL | Status: DC | PRN

## 2014-05-29 NOTE — ED Notes (Signed)
Patient continues to cough and requests cough medication. We'll prescribe tramadol  Gregor Hams, MD 05/29/14 [phone]

## 2014-05-30 NOTE — ED Notes (Signed)
Pt called  Inquiring  About  Rx     -   Got  An  Answering  Machine      Message  Left  For  Pt to call  back

## 2014-06-02 ENCOUNTER — Telehealth (HOSPITAL_COMMUNITY): Payer: Self-pay | Admitting: Emergency Medicine

## 2014-06-02 MED ORDER — TRAMADOL HCL 50 MG PO TABS
100.0000 mg | ORAL_TABLET | Freq: Three times a day (TID) | ORAL | Status: DC | PRN
Start: 2014-06-02 — End: 2015-09-10

## 2014-06-02 NOTE — ED Notes (Signed)
Dr Jake Michaelis agreed to write script for patient.

## 2014-07-14 ENCOUNTER — Emergency Department (INDEPENDENT_AMBULATORY_CARE_PROVIDER_SITE_OTHER)
Admission: EM | Admit: 2014-07-14 | Discharge: 2014-07-14 | Disposition: A | Discharge status: Home / Self Care | Payer: Self-pay | Source: Home / Self Care | Attending: Family Medicine | Admitting: Family Medicine

## 2014-07-14 ENCOUNTER — Encounter (HOSPITAL_COMMUNITY): Payer: Self-pay | Admitting: Emergency Medicine

## 2014-07-14 DIAGNOSIS — J36 Peritonsillar abscess: Secondary | ICD-10-CM

## 2014-07-14 NOTE — ED Notes (Signed)
Pt states that she has had a sore throat for 3 months with new onset of hoarseness

## 2014-07-14 NOTE — ED Provider Notes (Signed)
Alyssa Sullivan is a 33 y.o. female who presents to Urgent Care today for sore throat cough and voice change. Patient was seen in our clinic in late October for coughing congestion. She was prescribed prednisone and tramadol for cough medication. Since the last several months she's continued to have a mild cough which tramadol has not helped much. However on Friday she developed worsening sore throat, voice change and difficulty opening her mouth. Symptoms have worsened. She's tried Chloraseptic Spray and ginger tea which have not helped. No vomiting or diarrhea.   No past medical history on file. No past surgical history on file. History  Substance Use Topics  . Smoking status: Current Every Day Smoker    Types: Cigarettes  . Smokeless tobacco: Not on file  . Alcohol Use: 0.0 oz/week   ROS as above Medications: No current facility-administered medications for this encounter.   Current Outpatient Prescriptions  Medication Sig Dispense Refill  . predniSONE (DELTASONE) 50 MG tablet Take 1 tablet (50 mg total) by mouth daily. 5 tablet 0  . traMADol (ULTRAM) 50 MG tablet Take 1 tablet (50 mg total) by mouth at bedtime as needed (cough). 10 tablet 0  . traMADol (ULTRAM) 50 MG tablet Take 2 tablets (100 mg total) by mouth every 8 (eight) hours as needed. 30 tablet 0  . VITAMIN D, CHOLECALCIFEROL, PO Take by mouth.     Allergies  Allergen Reactions  . Codeine   . Penicillins      Exam:  BP 124/91 mmHg  Pulse 108  Temp(Src) 98.9 F (37.2 C) (Oral)  Resp 16  SpO2 100% Gen: Well NAD HEENT: EOMI,  MMM trismus present. Left midline shift of uvula to the right. Muffled voice Lungs: Normal work of breathing. CTABL Heart: RRR no MRG Abd: NABS, Soft. Nondistended, Nontender Exts: Brisk capillary refill, warm and well perfused.   No results found for this or any previous visit (from the past 24 hour(s)). No results found.  Assessment and Plan: 33 y.o. female with concern for  peritonsillar abscess on the left side. Discussed with on-call ENT Dr. Simeon Craft. Patient will have directly to [facility] now for evaluation and management.  Discussed warning signs or symptoms. Please see discharge instructions. Patient expresses understanding.     Gregor Hams, MD 07/14/14 [phone]

## 2014-07-14 NOTE — Discharge Instructions (Signed)
Thank you for coming in today. Go directly to [facility] and see Dr. Simeon Craft.

## 2014-10-25 ENCOUNTER — Emergency Department (HOSPITAL_COMMUNITY)
Admission: EM | Admit: 2014-10-25 | Discharge: 2014-10-25 | Disposition: A | Discharge status: Home / Self Care | Payer: Self-pay | Attending: Emergency Medicine | Admitting: Emergency Medicine

## 2014-10-25 ENCOUNTER — Emergency Department (HOSPITAL_COMMUNITY)
Admission: EM | Admit: 2014-10-25 | Discharge: 2014-10-25 | Discharge status: Against Medical Advice | Payer: Self-pay | Attending: Emergency Medicine | Admitting: Emergency Medicine

## 2014-10-25 ENCOUNTER — Emergency Department (HOSPITAL_COMMUNITY): Payer: Self-pay

## 2014-10-25 ENCOUNTER — Encounter (HOSPITAL_COMMUNITY): Payer: Self-pay | Admitting: Emergency Medicine

## 2014-10-25 ENCOUNTER — Encounter (HOSPITAL_COMMUNITY): Payer: Self-pay

## 2014-10-25 DIAGNOSIS — J45909 Unspecified asthma, uncomplicated: Secondary | ICD-10-CM | POA: Insufficient documentation

## 2014-10-25 DIAGNOSIS — R05 Cough: Secondary | ICD-10-CM | POA: Insufficient documentation

## 2014-10-25 DIAGNOSIS — Z720 Tobacco use: Secondary | ICD-10-CM | POA: Insufficient documentation

## 2014-10-25 DIAGNOSIS — K088 Other specified disorders of teeth and supporting structures: Secondary | ICD-10-CM | POA: Insufficient documentation

## 2014-10-25 DIAGNOSIS — Z8739 Personal history of other diseases of the musculoskeletal system and connective tissue: Secondary | ICD-10-CM | POA: Insufficient documentation

## 2014-10-25 DIAGNOSIS — Z7952 Long term (current) use of systemic steroids: Secondary | ICD-10-CM | POA: Insufficient documentation

## 2014-10-25 DIAGNOSIS — Z79899 Other long term (current) drug therapy: Secondary | ICD-10-CM | POA: Insufficient documentation

## 2014-10-25 DIAGNOSIS — Z880 Allergy status to penicillin: Secondary | ICD-10-CM | POA: Insufficient documentation

## 2014-10-25 DIAGNOSIS — R059 Cough, unspecified: Secondary | ICD-10-CM

## 2014-10-25 DIAGNOSIS — R1110 Vomiting, unspecified: Secondary | ICD-10-CM | POA: Insufficient documentation

## 2014-10-25 DIAGNOSIS — Z791 Long term (current) use of non-steroidal anti-inflammatories (NSAID): Secondary | ICD-10-CM | POA: Insufficient documentation

## 2014-10-25 DIAGNOSIS — K0889 Other specified disorders of teeth and supporting structures: Secondary | ICD-10-CM

## 2014-10-25 HISTORY — DX: Plantar fascial fibromatosis: M72.2

## 2014-10-25 HISTORY — DX: Calcaneal spur, unspecified foot: M77.30

## 2014-10-25 HISTORY — DX: Unspecified asthma, uncomplicated: J45.909

## 2014-10-25 LAB — COMPREHENSIVE METABOLIC PANEL
ALK PHOS: 59 U/L (ref 39–117)
ALT: 28 U/L (ref 0–35)
ANION GAP: 8 (ref 5–15)
AST: 30 U/L (ref 0–37)
Albumin: 3.5 g/dL (ref 3.5–5.2)
BILIRUBIN TOTAL: 0.9 mg/dL (ref 0.3–1.2)
BUN: 7 mg/dL (ref 6–23)
CALCIUM: 8.7 mg/dL (ref 8.4–10.5)
CO2: 24 mmol/L (ref 19–32)
Chloride: 100 mmol/L (ref 96–112)
Creatinine, Ser: 0.75 mg/dL (ref 0.50–1.10)
GLUCOSE: 114 mg/dL — AB (ref 70–99)
POTASSIUM: 3.9 mmol/L (ref 3.5–5.1)
Sodium: 132 mmol/L — ABNORMAL LOW (ref 135–145)
Total Protein: 7.6 g/dL (ref 6.0–8.3)

## 2014-10-25 LAB — CBC WITH DIFFERENTIAL/PLATELET
BASOS ABS: 0 10*3/uL (ref 0.0–0.1)
BASOS PCT: 0 % (ref 0–1)
Eosinophils Absolute: 0.1 10*3/uL (ref 0.0–0.7)
Eosinophils Relative: 1 % (ref 0–5)
HEMATOCRIT: 41.9 % (ref 36.0–46.0)
Hemoglobin: 14.1 g/dL (ref 12.0–15.0)
LYMPHS ABS: 2 10*3/uL (ref 0.7–4.0)
Lymphocytes Relative: 23 % (ref 12–46)
MCH: 31 pg (ref 26.0–34.0)
MCHC: 33.7 g/dL (ref 30.0–36.0)
MCV: 92.1 fL (ref 78.0–100.0)
MONO ABS: 0.6 10*3/uL (ref 0.1–1.0)
MONOS PCT: 7 % (ref 3–12)
NEUTROS PCT: 69 % (ref 43–77)
Neutro Abs: 5.8 10*3/uL (ref 1.7–7.7)
PLATELETS: 278 10*3/uL (ref 150–400)
RBC: 4.55 MIL/uL (ref 3.87–5.11)
RDW: 13.7 % (ref 11.5–15.5)
WBC: 8.4 10*3/uL (ref 4.0–10.5)

## 2014-10-25 MED ORDER — TRAMADOL HCL 50 MG PO TABS: 50.0000 mg | ORAL_TABLET | Freq: Four times a day (QID) | ORAL | Status: DC | PRN

## 2014-10-25 MED ORDER — ONDANSETRON HCL 4 MG PO TABS
4.0000 mg | ORAL_TABLET | Freq: Once | ORAL | Status: AC
  Administered 2014-10-25: 4 mg via ORAL
  Filled 2014-10-25: qty 1

## 2014-10-25 MED ORDER — OXYCODONE-ACETAMINOPHEN 5-325 MG PO TABS
2.0000 | ORAL_TABLET | Freq: Once | ORAL | Status: AC
  Administered 2014-10-25: 2 via ORAL
  Filled 2014-10-25: qty 2

## 2014-10-25 MED ORDER — BUPIVACAINE HCL 0.5 % IJ SOLN
50.0000 mL | Freq: Once | INTRAMUSCULAR | Status: AC
  Administered 2014-10-25: 50 mL
  Filled 2014-10-25: qty 50

## 2014-10-25 NOTE — Discharge Instructions (Signed)
Cough, Adult  A cough is a reflex that helps clear your throat and airways. It can help heal the body or may be a reaction to an irritated airway. A cough may only last 2 or 3 weeks (acute) or may last more than 8 weeks (chronic).  CAUSES Acute cough:  Viral or bacterial infections. Chronic cough:  Infections.  Allergies.  Asthma.  Post-nasal drip.  Smoking.  Heartburn or acid reflux.  Some medicines.  Chronic lung problems (COPD).  Cancer. SYMPTOMS   Cough.  Fever.  Chest pain.  Increased breathing rate.  High-pitched whistling sound when breathing (wheezing).  Colored mucus that you cough up (sputum). TREATMENT   A bacterial cough may be treated with antibiotic medicine.  A viral cough must run its course and will not respond to antibiotics.  Your caregiver may recommend other treatments if you have a chronic cough. HOME CARE INSTRUCTIONS   Only take over-the-counter or prescription medicines for pain, discomfort, or fever as directed by your caregiver. Use cough suppressants only as directed by your caregiver.  Use a cold steam vaporizer or humidifier in your bedroom or home to help loosen secretions.  Sleep in a semi-upright position if your cough is worse at night.  Rest as needed.  Stop smoking if you smoke. SEEK IMMEDIATE MEDICAL CARE IF:   You have pus in your sputum.  Your cough starts to worsen.  You cannot control your cough with suppressants and are losing sleep.  You begin coughing up blood.  You have difficulty breathing.  You develop pain which is getting worse or is uncontrolled with medicine.  You have a fever. MAKE SURE YOU:   Understand these instructions.  Will watch your condition.  Will get help right away if you are not doing well or get worse. Document Released: 01/14/2011 Document Revised: 10/10/2011 Document Reviewed: 01/14/2011 [facility] Patient Information 2015 [place], [place]. This information is not intended  to replace advice given to you by your health care provider. Make sure you discuss any questions you have with your health care provider.  Dental Pain A tooth ache may be caused by cavities (tooth decay). Cavities expose the nerve of the tooth to air and hot or cold temperatures. It may come from an infection or abscess (also called a boil or furuncle) around your tooth. It is also often caused by dental caries (tooth decay). This causes the pain you are having. DIAGNOSIS  Your caregiver can diagnose this problem by exam. TREATMENT   If caused by an infection, it may be treated with medications which kill germs (antibiotics) and pain medications as prescribed by your caregiver. Take medications as directed.  Only take over-the-counter or prescription medicines for pain, discomfort, or fever as directed by your caregiver.  Whether the tooth ache today is caused by infection or dental disease, you should see your dentist as soon as possible for further care. SEEK MEDICAL CARE IF: The exam and treatment you received today has been provided on an emergency basis only. This is not a substitute for complete medical or dental care. If your problem worsens or new problems (symptoms) appear, and you are unable to meet with your dentist, call or return to this location. SEEK IMMEDIATE MEDICAL CARE IF:   You have a fever.  You develop redness and swelling of your face, jaw, or neck.  You are unable to open your mouth.  You have severe pain uncontrolled by pain medicine. MAKE SURE YOU:   Understand these instructions.  Will watch your condition.  Will get help right away if you are not doing well or get worse. Document Released: 07/18/2005 Document Revised: 10/10/2011 Document Reviewed: 03/05/2008 [facility] Patient Information 2015 [place], [place]. This information is not intended to replace advice given to you by your health care provider. Make sure you discuss any questions you have with your  health care provider.   Emergency Department Resource Guide 1) Find a Doctor and Pay Out of Pocket Although you won't have to find out who is covered by your insurance plan, it is a good idea to ask around and get recommendations. You will then need to call the office and see if the doctor you have chosen will accept you as a new patient and what types of options they offer for patients who are self-pay. Some doctors offer discounts or will set up payment plans for their patients who do not have insurance, but you will need to ask so you aren't surprised when you get to your appointment.  2) Contact Your Local Health Department Not all health departments have doctors that can see patients for sick visits, but many do, so it is worth a call to see if yours does. If you don't know where your local health department is, you can check in your phone book. The CDC also has a tool to help you locate your state's health department, and many state websites also have listings of all of their local health departments.  3) Find a [facility] If your illness is not likely to be very severe or complicated, you may want to try a walk in clinic. These are popping up all over the country in pharmacies, drugstores, and shopping centers. They're usually staffed by nurse practitioners or physician assistants that have been trained to treat common illnesses and complaints. They're usually fairly quick and inexpensive. However, if you have serious medical issues or chronic medical problems, these are probably not your best option.  No Primary Care Doctor: - Call Health Connect at  [phone] - they can help you locate a primary care doctor that  accepts your insurance, provides certain services, etc. - Physician Referral Service- [phone]  Chronic Pain Problems: Organization         Address  Phone   Notes  [facility]  [phone] Patients need to be referred by their primary care doctor.    Medication Assistance: Organization         Address  Phone   Notes  [facility] Medication [facility] [address] [place], [place] [address] [phone] --Must be a resident of [facility] -- Must have NO insurance coverage whatsoever (no Medicaid/ Medicare, etc.) -- The pt. MUST have a primary care doctor that directs their care regularly and follows them in the community   MedAssist  [phone]   [employer]  [phone]    Agencies that provide inexpensive medical care: Organization         Address  Phone   Notes  [place]  [phone]   [facility]    [phone]   [facility] [place], [place] [address] [phone]   [place] [address], [place] [phone]   Planned Parenthood    [phone]   [facility]    [phone]   [place] [place],  [place] Phone:  [phone], Fax:  [phone] Hours of Operation:  9 am - 6 pm, M-F.  Also accepts Medicaid/Medicare and self-pay.  [facility] for [place], [address], [place] Phone: [phone], Fax: [phone]. Hours of Operation:  8:30 am - 5:30 pm, M-F.  Also accepts Medicaid and self-pay.  [facility] [place] [address], [place] Phone: [phone]   [place], [place], [place] [phone], Ext. [phone] Mondays & Thursdays: 7-9 AM.  First 15 patients are seen on a first come, first serve basis.    [place] Providers:  Organization         Address  Phone   Notes  [facility] [address], [place] [phone] Also accepts self-pay patients.  [facility] [address], [place]  [phone]   [place], [address], [place] [phone]   [facility] [address] [place] [phone]   Lucianne Lei [address], [place]   [phone] Only accepts [place] Access [place] patients after they have their name applied to their card.   Self-Pay (no insurance) in [facility]:  Organization         Address  Phone   Notes  Sickle Cell Patients, [facility] [place] [phone]   [facility] [place] [phone]   [facility] [place]  [place], [place], [place] [phone]   [facility]/Dr. Osei-Bonsu  [address], [place] or [address], [place] [phone] Phone number for both [place] and [place] locations is the same.  Urgent Medical and [facility] [address] [place] [phone]   [facility] [address] [place] or [address] [phone]   [facility] [address] [place] [phone], phone; [phone], fax Sees patients 1st and 3rd Saturday of every month.  Must not qualify for public or private insurance (i.e. Medicaid, Medicare, [place] Health Choice, Veterans' Benefits)  Household income should be no more than 200% of the poverty level The clinic cannot treat you if you are pregnant or think you are pregnant  Sexually transmitted diseases are not treated at the clinic.    Dental Care: Organization         Address  Phone  Notes  [facility] Department of [facility] [place] [phone] Accepts children up to age 36 who are enrolled in [place] or [place]; pregnant women with a Medicaid card; and children who have applied for Medicaid or  Health Choice, but were declined, whose parents can pay a reduced fee at time of service.   [facility] Department of [facility]  [address], [place] [phone] Accepts children up to age 33 who are enrolled in [place] or [place]; pregnant women with a Medicaid card; and children who have applied for Medicaid or  Health Choice, but were declined, whose parents can pay a reduced fee at time of service.  [place] [facility] Access PROGRAM  [place] [phone] Patients are seen by  appointment only. Walk-ins are not accepted. [place] will see patients 49 years of age and older. Monday - Tuesday (8am-5pm) Most Wednesdays (8:30-5pm) $30 per visit, cash only  [facility] Adult Dental Access PROGRAM  [address], [facility] [phone] Patients are seen by appointment only. Walk-ins are not accepted. [place] will see patients 48 years of age and older. One Wednesday Evening (Monthly: Volunteer Based).  $30 per visit, cash only  [place]  [phone] for adults; Children under age 38, call Graduate Pediatric Dentistry at [phone]. Children aged 37-14, please call [phone] to request a pediatric application.  Dental services are provided in all areas of dental care including fillings, crowns and bridges, complete and partial dentures, implants, gum treatment, root canals, and extractions. Preventive care is also provided. Treatment is provided to both adults and children. Patients are selected via a lottery and there is often a waiting list.   [facility] [address], [place]  [phone] [email or web address]   [facility] [address] [place], [place] [phone], Ext. [phone] Second and Fourth Thursday of each month, opens at 6:30 AM; Clinic ends at 9 AM.  Patients are seen on a first-come first-served basis, and a limited number are seen during each clinic.   [facility]  [address] [place], [place] [phone]   Eligibility Requirements You must have lived in [place], [place], or [place] counties for at least the last three months.   You cannot be eligible for state or federal sponsored [employer], including [employer], [place], or [employer].   You generally cannot be eligible for healthcare insurance through your employer.    How to apply: Eligibility screenings are held every Tuesday and Wednesday afternoon from 1:00 pm until 4:00 pm. You do not need an appointment for the interview!  [facility] [address] [place], [place]   [place]  [place] Department  [place]  [phone]    Behavioral Health Resources in the Community: Intensive Outpatient Programs Organization         Address  Phone  Notes  [place]. [address], [place], [place] [phone]   [facility] [address] [place], [place]   ADS: Alcohol & Drug Svcs [address], [place], [place]   [place] [address],  [place], [place] or [phone]   Substance Abuse Resources Organization         Address  Phone  Notes  Alcohol and Drug Services  [phone]   [place]  [phone]   [facility] [place]   Chinita Pester  [phone]   Residential & Outpatient Substance Abuse Program  [phone]   Psychological Services Organization         Address  Phone  Notes  [facility] [place]  [place]  [phone]   [place] [address] [place] [phone] or [phone]    Mobile Crisis Teams Organization         Address  Phone  Notes  [facility], Mobile Crisis Care Unit  [phone]   [facility]  [address] [place], [place]   [facility] [address], [place] [phone]  Self-Help/Support Groups Organization         Address  Phone             Notes  Mental Health Assoc. of [place] - variety of support groups  [place] Call for more information  Narcotics Anonymous (NA), [facility] [address], [employer] [place]  2 meetings at this location   Special educational needs teacher         Address  Phone  Notes  [facility] [place],    [place]  [phone]   [facility]  [address] [place] [address]35701, [place], [place]   [place], [place] [phone] Admissions: 8am-3pm M-F  Incentives Substance [place] [address]    [place], [place] [phone]   [facility] [address] [place], [place], [place]   [facility] [address]  [place], [place]   [facility] - Intensive Outpatient [address], [place], [place]   [facility] ([place].,  [place], [place] [phone] or [phone]   [facility] (RTS) [address] [place], [place] Accepts Medicaid  [facility] [place] [address],  [place] [phone] Substance Abuse/Addiction Treatment   [facility] Organization         Address  Phone  Notes  [facility]  [phone]   Domenic Schwab, PhD [address] [place], [place]   [phone] or [phone]   [place], [place] [phone]   [facility] [address], [place], [place] [phone] Insurance/Medicaid/sponsorship through [facility] and Families [address], [address] [place]                                    [place], [place] [phone]  [place] [address], [place] [phone]    Dr. Adele Schilder  [phone]   [facility] Dept. 1) [address], [place] 2) [place] 3)  [address], [place] [phone]  [phone]   [place] [phone] or [phone] (After Hours)

## 2014-10-25 NOTE — ED Notes (Signed)
Pt c/o right lower toothache ongoing for 1 month. Pt also reports productive cough ongoing "for a long time."  Pt talking in complete sentences without difficulty.

## 2014-10-25 NOTE — ED Notes (Addendum)
Pt presents with c/o dental pain and vomiting. Pt reports that both the dental pain and the vomiting started yesterday. Pt just left [facility] because she said her girlfriend was hungry and is diabetic and she needed to leave and get her something to eat.

## 2014-10-29 NOTE — ED Provider Notes (Signed)
CSN: [id number]     Arrival date & time 10/25/14  49 History   First MD Initiated Contact with Patient 10/25/14 1640     Chief Complaint  Patient presents with  . Dental Pain  . Emesis     (Consider location/radiation/quality/duration/timing/severity/associated sxs/prior Treatment) HPI  34yF with dental pain. Gradual onset about one month ago. Right lower tooth. Worse over the last few days. No drainage. No fevers or chills. No facial swelling. No difficulty breathing or swallowing. Has not had dental appointment for this yet. Nausea. No abdominal pain. No acute respiratory complaints aside from a nonproductive cough. No fevers or chills.  Past Medical History  Diagnosis Date  . Asthma   . Heel spur   . Plantar fasciitis    Past Surgical History  Procedure Laterality Date  . Mouth surgery     No family history on file. History  Substance Use Topics  . Smoking status: Current Every Day Smoker    Types: Cigarettes  . Smokeless tobacco: Not on file  . Alcohol Use: 0.0 oz/week     Comment:  wine daily   OB History    No data available     Review of Systems  All systems reviewed and negative, other than as noted in HPI.   Allergies  Codeine and Penicillins  Home Medications   Prior to Admission medications   Medication Sig Start Date End Date Taking? Authorizing Provider  albuterol (PROVENTIL HFA;VENTOLIN HFA) 108 (90 BASE) MCG/ACT inhaler Inhale 2 puffs into the lungs every 6 (six) hours as needed for wheezing or shortness of breath.   Yes Historical Provider, MD  naproxen sodium (ANAPROX) 220 MG tablet Take 660 mg by mouth 2 (two) times daily as needed (pain).   Yes Historical Provider, MD  predniSONE (DELTASONE) 50 MG tablet Take 1 tablet (50 mg total) by mouth daily. Patient not taking: Reported on 10/25/2014 05/27/14   Gregor Hams, MD  traMADol (ULTRAM) 50 MG tablet Take 1 tablet (50 mg total) by mouth at bedtime as needed (cough). Patient not taking: Reported  on 10/25/2014 05/29/14   Gregor Hams, MD  traMADol (ULTRAM) 50 MG tablet Take 2 tablets (100 mg total) by mouth every 8 (eight) hours as needed. Patient not taking: Reported on 10/25/2014 06/02/14   Harden Mo, MD  traMADol (ULTRAM) 50 MG tablet Take 1 tablet (50 mg total) by mouth every 6 (six) hours as needed. 10/25/14   Virgel Manifold, MD   BP 140/95 mmHg  Pulse 89  Temp(Src) 98.7 F (37.1 C) (Oral)  Resp 18  SpO2 95%  LMP 10/13/2014 Physical Exam  Constitutional: She appears well-developed and well-nourished. No distress.  HENT:  Head: Normocephalic and atraumatic.  R lower premolar cracked at gingiva. No discrete abscess. No evidence of deep space head/neck infection.   Eyes: Conjunctivae are normal. Right eye exhibits no discharge. Left eye exhibits no discharge.  Neck: Neck supple.  Cardiovascular: Normal rate, regular rhythm and normal heart sounds.  Exam reveals no gallop and no friction rub.   No murmur heard. Pulmonary/Chest: Effort normal and breath sounds normal. No respiratory distress.  Abdominal: Soft. She exhibits no distension. There is no tenderness.  Musculoskeletal: She exhibits no edema or tenderness.  Neurological: She is alert.  Skin: Skin is warm and dry.  Psychiatric: She has a normal mood and affect. Her behavior is normal. Thought content normal.  Nursing note and vitals reviewed.   ED Course  Procedures (including critical  care time) Labs Review Labs Reviewed - No data to display  Imaging Review No results found.   EKG Interpretation None      MDM   Final diagnoses:  Cough  Pain, dental        Virgel Manifold, MD 10/29/14 [phone]

## 2015-09-10 ENCOUNTER — Encounter (HOSPITAL_COMMUNITY): Payer: Self-pay | Admitting: *Deleted

## 2015-09-10 ENCOUNTER — Emergency Department (INDEPENDENT_AMBULATORY_CARE_PROVIDER_SITE_OTHER): Payer: Self-pay

## 2015-09-10 ENCOUNTER — Emergency Department (INDEPENDENT_AMBULATORY_CARE_PROVIDER_SITE_OTHER)
Admission: EM | Admit: 2015-09-10 | Discharge: 2015-09-10 | Disposition: A | Discharge status: Home / Self Care | Payer: Self-pay | Source: Home / Self Care | Attending: Family Medicine | Admitting: Family Medicine

## 2015-09-10 DIAGNOSIS — R05 Cough: Secondary | ICD-10-CM

## 2015-09-10 DIAGNOSIS — M5431 Sciatica, right side: Secondary | ICD-10-CM

## 2015-09-10 DIAGNOSIS — R053 Chronic cough: Secondary | ICD-10-CM

## 2015-09-10 MED ORDER — TRAMADOL HCL 50 MG PO TABS: 50.0000 mg | ORAL_TABLET | Freq: Four times a day (QID) | ORAL | Status: DC | PRN

## 2015-09-10 MED ORDER — PREDNISONE 10 MG PO TABS: ORAL_TABLET | ORAL | Status: DC

## 2015-09-10 NOTE — Discharge Instructions (Signed)
Cough, Adult Coughing is a reflex that clears your throat and your airways. Coughing helps to heal and protect your lungs. It is normal to cough occasionally, but a cough that happens with other symptoms or lasts a long time may be a sign of a condition that needs treatment. A cough may last only 2-3 weeks (acute), or it may last longer than 8 weeks (chronic). CAUSES Coughing is commonly caused by:  Breathing in substances that irritate your lungs.  A viral or bacterial respiratory infection.  Allergies.  Asthma.  Postnasal drip.  Smoking.  Acid backing up from the stomach into the esophagus (gastroesophageal reflux).  Certain medicines.  Chronic lung problems, including COPD (or rarely, lung cancer).  Other medical conditions such as heart failure. HOME CARE INSTRUCTIONS  Pay attention to any changes in your symptoms. Take these actions to help with your discomfort:  Take medicines only as told by your health care provider.  If you were prescribed an antibiotic medicine, take it as told by your health care provider. Do not stop taking the antibiotic even if you start to feel better.  Talk with your health care provider before you take a cough suppressant medicine.  Drink enough fluid to keep your urine clear or pale yellow.  If the air is dry, use a cold steam vaporizer or humidifier in your bedroom or your home to help loosen secretions.  Avoid anything that causes you to cough at work or at home.  If your cough is worse at night, try sleeping in a semi-upright position.  Avoid cigarette smoke. If you smoke, quit smoking. If you need help quitting, ask your health care provider.  Avoid caffeine.  Avoid alcohol.  Rest as needed. SEEK MEDICAL CARE IF:   You have new symptoms.  You cough up pus.  Your cough does not get better after 2-3 weeks, or your cough gets worse.  You cannot control your cough with suppressant medicines and you are losing sleep.  You  develop pain that is getting worse or pain that is not controlled with pain medicines.  You have a fever.  You have unexplained weight loss.  You have night sweats. SEEK IMMEDIATE MEDICAL CARE IF:  You cough up blood.  You have difficulty breathing.  Your heartbeat is very fast.   This information is not intended to replace advice given to you by your health care provider. Make sure you discuss any questions you have with your health care provider.   Document Released: 01/14/2011 Document Revised: 04/08/2015 Document Reviewed: 09/24/2014 Elsevier Interactive Patient Education 2016 Elsevier Inc. Sciatica Sciatica is pain, weakness, numbness, or tingling along the path of the sciatic nerve. The nerve starts in the lower back and runs down the back of each leg. The nerve controls the muscles in the lower leg and in the back of the knee, while also providing sensation to the back of the thigh, lower leg, and the sole of your foot. Sciatica is a symptom of another medical condition. For instance, nerve damage or certain conditions, such as a herniated disk or bone spur on the spine, pinch or put pressure on the sciatic nerve. This causes the pain, weakness, or other sensations normally associated with sciatica. Generally, sciatica only affects one side of the body. CAUSES   Herniated or slipped disc.  Degenerative disk disease.  A pain disorder involving the narrow muscle in the buttocks (piriformis syndrome).  Pelvic injury or fracture.  Pregnancy.  Tumor (rare). SYMPTOMS  Symptoms  can vary from mild to very severe. The symptoms usually travel from the low back to the buttocks and down the back of the leg. Symptoms can include:  Mild tingling or dull aches in the lower back, leg, or hip.  Numbness in the back of the calf or sole of the foot.  Burning sensations in the lower back, leg, or hip.  Sharp pains in the lower back, leg, or hip.  Leg weakness.  Severe back pain  inhibiting movement. These symptoms may get worse with coughing, sneezing, laughing, or prolonged sitting or standing. Also, being overweight may worsen symptoms. DIAGNOSIS  Your caregiver will perform a physical exam to look for common symptoms of sciatica. He or she may ask you to do certain movements or activities that would trigger sciatic nerve pain. Other tests may be performed to find the cause of the sciatica. These may include:  Blood tests.  X-rays.  Imaging tests, such as an MRI or CT scan. TREATMENT  Treatment is directed at the cause of the sciatic pain. Sometimes, treatment is not necessary and the pain and discomfort goes away on its own. If treatment is needed, your caregiver may suggest:  Over-the-counter medicines to relieve pain.  Prescription medicines, such as anti-inflammatory medicine, muscle relaxants, or narcotics.  Applying heat or ice to the painful area.  Steroid injections to lessen pain, irritation, and inflammation around the nerve.  Reducing activity during periods of pain.  Exercising and stretching to strengthen your abdomen and improve flexibility of your spine. Your caregiver may suggest losing weight if the extra weight makes the back pain worse.  Physical therapy.  Surgery to eliminate what is pressing or pinching the nerve, such as a bone spur or part of a herniated disk. HOME CARE INSTRUCTIONS   Only take over-the-counter or prescription medicines for pain or discomfort as directed by your caregiver.  Apply ice to the affected area for 20 minutes, 3-4 times a day for the first 48-72 hours. Then try heat in the same way.  Exercise, stretch, or perform your usual activities if these do not aggravate your pain.  Attend physical therapy sessions as directed by your caregiver.  Keep all follow-up appointments as directed by your caregiver.  Do not wear high heels or shoes that do not provide proper support.  Check your mattress to see if it  is too soft. A firm mattress may lessen your pain and discomfort. SEEK IMMEDIATE MEDICAL CARE IF:   You lose control of your bowel or bladder (incontinence).  You have increasing weakness in the lower back, pelvis, buttocks, or legs.  You have redness or swelling of your back.  You have a burning sensation when you urinate.  You have pain that gets worse when you lie down or awakens you at night.  Your pain is worse than you have experienced in the past.  Your pain is lasting longer than 4 weeks.  You are suddenly losing weight without reason. MAKE SURE YOU:  Understand these instructions.  Will watch your condition.  Will get help right away if you are not doing well or get worse.   This information is not intended to replace advice given to you by your health care provider. Make sure you discuss any questions you have with your health care provider.   Document Released: 07/12/2001 Document Revised: 04/08/2015 Document Reviewed: 11/27/2011 Elsevier Interactive Patient Education [employer].

## 2015-09-10 NOTE — ED Notes (Signed)
pT  REPORTS  SYMPTOMS  OF  COUGH   /  CONGESTED    NON  PRODUCTIVE  IN NATURE       REPORTS  COUGH    IS  NOT  RELEIVED  BY OTC  MEDS         PT  REPORTS  THE  COUGH      HAS  PERSISTED  FOR  ABOUT  1  YEAR  SHE  HAS  SEEN THE  DOCTOR  FOR IT  BUT  HAS  NO  PCP

## 2015-09-10 NOTE — ED Provider Notes (Signed)
CSN: [id number]     Arrival date & time 09/10/15  1849 History   First MD Initiated Contact with Patient 09/10/15 2041     Chief Complaint  Patient presents with  . Cough   (Consider location/radiation/quality/duration/timing/severity/associated sxs/prior Treatment) Patient is a 35 y.o. female presenting with cough. The history is provided by the patient. No language interpreter was used.  Cough Cough characteristics:  Productive Severity:  Moderate Onset quality:  Gradual Duration:  52 weeks Timing:  Constant Progression:  Worsening Chronicity:  Recurrent Smoker: no   Relieved by:  Nothing Worsened by:  Nothing tried Ineffective treatments:  None tried Associated symptoms: rhinorrhea   Associated symptoms: no fever and no headaches     Past Medical History  Diagnosis Date  . Asthma   . Heel spur   . Plantar fasciitis    Past Surgical History  Procedure Laterality Date  . Mouth surgery     History reviewed. No pertinent family history. Social History  Substance Use Topics  . Smoking status: Current Every Day Smoker    Types: Cigarettes  . Smokeless tobacco: None  . Alcohol Use: 0.0 oz/week     Comment:  wine daily   OB History    No data available     Review of Systems  Constitutional: Negative for fever.  HENT: Positive for rhinorrhea.   Respiratory: Positive for cough.   Neurological: Negative for headaches.  All other systems reviewed and are negative. Pt also complains of pain in her low back and her right leg,   Pt reports she has pain in her foot Allergies  Codeine and Penicillins  Home Medications   Prior to Admission medications   Medication Sig Start Date End Date Taking? Authorizing Provider  albuterol (PROVENTIL HFA;VENTOLIN HFA) 108 (90 BASE) MCG/ACT inhaler Inhale 2 puffs into the lungs every 6 (six) hours as needed for wheezing or shortness of breath.    Historical Provider, MD  naproxen sodium (ANAPROX) 220 MG tablet Take 660 mg by mouth 2  (two) times daily as needed (pain).    Historical Provider, MD  predniSONE (DELTASONE) 50 MG tablet Take 1 tablet (50 mg total) by mouth daily. Patient not taking: Reported on 10/25/2014 05/27/14   Gregor Hams, MD  traMADol (ULTRAM) 50 MG tablet Take 1 tablet (50 mg total) by mouth at bedtime as needed (cough). Patient not taking: Reported on 10/25/2014 05/29/14   Gregor Hams, MD  traMADol (ULTRAM) 50 MG tablet Take 2 tablets (100 mg total) by mouth every 8 (eight) hours as needed. Patient not taking: Reported on 10/25/2014 06/02/14   Harden Mo, MD  traMADol (ULTRAM) 50 MG tablet Take 1 tablet (50 mg total) by mouth every 6 (six) hours as needed. 10/25/14   Virgel Manifold, MD   Meds Ordered and Administered this Visit  Medications - No data to display  BP 138/99 mmHg  Pulse 93  Temp(Src) 98 F (36.7 C) (Oral)  Resp 16  SpO2 97%  LMP 08/20/2015 (Exact Date) No data found.   Physical Exam  Constitutional: She is oriented to person, place, and time. She appears well-developed and well-nourished.  HENT:  Head: Normocephalic and atraumatic.  Eyes: Conjunctivae and EOM are normal. Pupils are equal, round, and reactive to light.  Neck: Normal range of motion. Neck supple.  Cardiovascular: Normal rate.   Pulmonary/Chest: Effort normal.  Abdominal: Soft.  Musculoskeletal: Normal range of motion. She exhibits tenderness.  Tender right foot.    Neurological:  She is alert and oriented to person, place, and time.  Skin: Skin is warm.  Psychiatric: She has a normal mood and affect.  Nursing note and vitals reviewed.   ED Course  Procedures (including critical care time)  Labs Review Labs Reviewed - No data to display  Imaging Review Dg Chest 2 View  09/10/2015  CLINICAL DATA:  Cough for 1 year. EXAM: CHEST  2 VIEW COMPARISON:  October 25, 2014 FINDINGS: The heart size and mediastinal contours are within normal limits. There is no focal infiltrate, pulmonary edema, or pleural effusion.  The visualized skeletal structures are unremarkable. IMPRESSION: No active cardiopulmonary disease. Electronically Signed   By: Abelardo Diesel M.D.   On: 09/10/2015 21:18     Visual Acuity Review  Right Eye Distance:   Left Eye Distance:   Bilateral Distance:    Right Eye Near:   Left Eye Near:    Bilateral Near:         MDM  I advised pt she needs a primary MD.   I will treat with prednisone taper for possible sciatica,  This may help chronic cough   1. Chronic cough   2. Sciatica, right    Meds ordered this encounter  Medications  . predniSONE (DELTASONE) 10 MG tablet    Sig: 6,5,4,3,2,1 taper    Dispense:  21 tablet    Refill:  0    Order Specific Question:  Supervising Provider    Answer:  Billy Fischer [phone]  . traMADol (ULTRAM) 50 MG tablet    Sig: Take 1 tablet (50 mg total) by mouth every 6 (six) hours as needed.    Dispense:  15 tablet    Refill:  0    Order Specific Question:  Supervising Provider    Answer:  Billy Fischer [phone]  An After Visit Summary was printed and given to the patient.    Anna, PA-C 09/10/15 2131

## 2015-09-17 MED FILL — predniSONE 10 MG TABS: 10 | 6 days supply | Qty: 21 | Fill #0

## 2015-09-17 MED FILL — traMADol HCL 50 MG TABS: 50 | 4 days supply | Qty: 15 | Fill #0

## 2016-11-24 ENCOUNTER — Ambulatory Visit (HOSPITAL_COMMUNITY)
Admission: EM | Admit: 2016-11-24 | Discharge: 2016-11-24 | Disposition: A | Discharge status: Home / Self Care | Payer: PRIVATE HEALTH INSURANCE | Attending: Family Medicine | Admitting: Family Medicine

## 2016-11-24 ENCOUNTER — Encounter (HOSPITAL_COMMUNITY): Payer: Self-pay | Admitting: Emergency Medicine

## 2016-11-24 DIAGNOSIS — S86912A Strain of unspecified muscle(s) and tendon(s) at lower leg level, left leg, initial encounter: Secondary | ICD-10-CM | POA: Diagnosis not present

## 2016-11-24 DIAGNOSIS — R059 Cough, unspecified: Secondary | ICD-10-CM

## 2016-11-24 DIAGNOSIS — J683 Other acute and subacute respiratory conditions due to chemicals, gases, fumes and vapors: Secondary | ICD-10-CM | POA: Diagnosis not present

## 2016-11-24 DIAGNOSIS — M5431 Sciatica, right side: Secondary | ICD-10-CM

## 2016-11-24 DIAGNOSIS — R05 Cough: Secondary | ICD-10-CM

## 2016-11-24 MED ORDER — METHYLPREDNISOLONE 4 MG PO TBPK: ORAL_TABLET | ORAL | 1 refills | Status: DC

## 2016-11-24 MED ORDER — OLOPATADINE HCL 0.2 % OP SOLN
1.0000 [drp] | Freq: Every day | OPHTHALMIC | 4 refills | Status: DC
Start: 2016-11-24 — End: 2020-07-26

## 2016-11-24 MED ORDER — BENZONATATE 100 MG PO CAPS: 100.0000 mg | ORAL_CAPSULE | Freq: Three times a day (TID) | ORAL | 0 refills | Status: DC | PRN

## 2016-11-24 MED ORDER — ALBUTEROL SULFATE HFA 108 (90 BASE) MCG/ACT IN AERS: 2.0000 | INHALATION_SPRAY | RESPIRATORY_TRACT | 1 refills | Status: DC | PRN

## 2016-11-24 NOTE — Discharge Instructions (Signed)
Do everything you can stop smoking Please return if your cough or leg soreness is not resolving after 3 or 4 days of the medicine.

## 2016-11-24 NOTE — ED Provider Notes (Signed)
[facility]    CSN: [id number] Arrival date & time: 11/24/16  1131     History   Chief Complaint Chief Complaint  Patient presents with  . Cough  . Leg Pain    HPI Alyssa Sullivan is a 36 y.o. female.   Here for chronic prod cough onset 1 year... Has been to other Dr's office and has been treated bronchitis  Also c/o bilateral leg and foot pain onset 6 months  Denies inj/trauma  Is a 36 year old woman who works 2 jobs. 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 She's complaining about bilateral leg pain, stiffness and soreness on the right with a sore knee on the left. She has trouble completely straighten out her left knee because of which he describes a stretching feeling inside.  She's had a cough for several months. She's tried prednisone in the past. She continues to smoke. Occasionally she'll have a severe coughing fit and vomit afterwards. She feels a rattling inside of her chest.      Past Medical History:  Diagnosis Date  . Asthma   . Heel spur   . Plantar fasciitis     Patient Active Problem List   Diagnosis Date Noted  . Chronic hip pain 02/21/2013  . Low back pain with sciatica 02/21/2013    Past Surgical History:  Procedure Laterality Date  . MOUTH SURGERY      OB History    No data available       Home Medications    Prior to Admission medications   Medication Sig Start Date End Date Taking? Authorizing Provider  albuterol (PROVENTIL HFA;VENTOLIN HFA) 108 (90 Base) MCG/ACT inhaler Inhale 2 puffs into the lungs every 4 (four) hours as needed for wheezing or shortness of breath (cough, shortness of breath or wheezing.). 11/24/16   Robyn Haber, MD  benzonatate (TESSALON) 100 MG capsule Take 1-2 capsules (100-200 mg total) by mouth 3 (three) times daily as needed for cough. 11/24/16   Robyn Haber, MD  methylPREDNISolone (MEDROL DOSEPAK) 4 MG TBPK tablet 6-5-4-3-2-1 11/24/16   Robyn Haber, MD    Olopatadine HCl (PATADAY) 0.2 % SOLN Apply 1 drop to eye daily. 11/24/16   Robyn Haber, MD  traMADol (ULTRAM) 50 MG tablet Take 1 tablet (50 mg total) by mouth every 6 (six) hours as needed. 09/10/15   Fransico Meadow, PA-C    Family History History reviewed. No pertinent family history.  Social History Social History  Substance Use Topics  . Smoking status: Current Every Day Smoker    Types: Cigarettes  . Smokeless tobacco: Never Used  . Alcohol use 0.0 oz/week     Comment:  wine daily     Allergies   Codeine and Penicillins   Review of Systems Review of Systems  Constitutional: Negative.   HENT: Positive for postnasal drip.   Eyes: Positive for redness.  Respiratory: Positive for cough and wheezing.   Musculoskeletal: Positive for gait problem and joint swelling.     Physical Exam Triage Vital Signs ED Triage Vitals  Enc Vitals Group     BP 11/24/16 1159 (!) 143/96     Pulse Rate 11/24/16 1159 (!) 101     Resp 11/24/16 1159 16     Temp 11/24/16 1159 98.3 F (36.8 C)     Temp Source 11/24/16 1159 Oral     SpO2 11/24/16 1159 97 %     Weight --  Height --      Head Circumference --      Peak Flow --      Pain Score 11/24/16 1205 7     Pain Loc --      Pain Edu? --      Excl. in [place]? --    No data found.   Updated Vital Signs BP (!) 143/96 (BP Location: Right Arm) Comment: notified rn  Pulse (!) 101 Comment: notified rn  Temp 98.3 F (36.8 C) (Oral)   Resp 16   SpO2 97%    Physical Exam  Constitutional: She is oriented to person, place, and time. She appears well-developed and well-nourished.  HENT:  Head: Normocephalic.  Right Ear: External ear normal.  Left Ear: External ear normal.  Mouth/Throat: Oropharynx is clear and moist.  Eyes: Conjunctivae and EOM are normal. Pupils are equal, round, and reactive to light.  Neck: Normal range of motion. Neck supple.  Cardiovascular: Normal rate, regular rhythm and normal heart sounds.    Pulmonary/Chest: Effort normal. She has wheezes.  Musculoskeletal: She exhibits no edema or deformity.  Some pain with straight leg raising on the right Discomfort in the knee when attempting to straighten her left leg No localized tenderness on either leg No calf tenderness or edema.  Neurological: She is alert and oriented to person, place, and time.  Skin: Skin is warm and dry.  Nursing note and vitals reviewed.    UC Treatments / Results  Labs (all labs ordered are listed, but only abnormal results are displayed) Labs Reviewed - No data to display  EKG  EKG Interpretation None       Radiology No results found.  Procedures Procedures (including critical care time)  Medications Ordered in UC Medications - No data to display   Initial Impression / Assessment and Plan / UC Course  I have reviewed the triage vital signs and the nursing notes.  Pertinent labs & imaging results that were available during my care of the patient were reviewed by me and considered in my medical decision making (see chart for details).     Final Clinical Impressions(s) / UC Diagnoses   Final diagnoses:  Cough  Reactive airways dysfunction syndrome (HCC)  Right sided sciatica  Knee strain, left, initial encounter    New Prescriptions New Prescriptions   ALBUTEROL (PROVENTIL HFA;VENTOLIN HFA) 108 (90 BASE) MCG/ACT INHALER    Inhale 2 puffs into the lungs every 4 (four) hours as needed for wheezing or shortness of breath (cough, shortness of breath or wheezing.).   BENZONATATE (TESSALON) 100 MG CAPSULE    Take 1-2 capsules (100-200 mg total) by mouth 3 (three) times daily as needed for cough.   METHYLPREDNISOLONE (MEDROL DOSEPAK) 4 MG TBPK TABLET    6-5-4-3-2-1   OLOPATADINE HCL (PATADAY) 0.2 % SOLN    Apply 1 drop to eye daily.     Robyn Haber, MD 11/24/16 1241

## 2016-11-24 NOTE — ED Triage Notes (Signed)
Here for chronic prod cough onset 1 year... Has been to other Dr's office and has been treated bronchitis  Also c/o bilateral leg and foot pain onset 6 months  Denies inj/trauma  A&O x4... NAD... Steady gait.

## 2016-11-25 MED FILL — METHYLPREDNISOLONE 4 MG TAB: 4 | 6 days supply | Qty: 21 | Fill #0

## 2016-11-25 MED FILL — BENZONATATE 100 MG CAP: 100 | 7 days supply | Qty: 40 | Fill #0

## 2017-06-13 ENCOUNTER — Other Ambulatory Visit: Payer: Self-pay

## 2017-06-13 ENCOUNTER — Ambulatory Visit (HOSPITAL_COMMUNITY)
Admission: EM | Admit: 2017-06-13 | Discharge: 2017-06-13 | Disposition: A | Discharge status: Home / Self Care | Payer: PRIVATE HEALTH INSURANCE | Attending: Family Medicine | Admitting: Family Medicine

## 2017-06-13 ENCOUNTER — Encounter (HOSPITAL_COMMUNITY): Payer: Self-pay | Admitting: Emergency Medicine

## 2017-06-13 DIAGNOSIS — J40 Bronchitis, not specified as acute or chronic: Secondary | ICD-10-CM

## 2017-06-13 DIAGNOSIS — J0100 Acute maxillary sinusitis, unspecified: Secondary | ICD-10-CM

## 2017-06-13 DIAGNOSIS — L858 Other specified epidermal thickening: Secondary | ICD-10-CM

## 2017-06-13 DIAGNOSIS — R292 Abnormal reflex: Secondary | ICD-10-CM

## 2017-06-13 MED ORDER — BENZONATATE 100 MG PO CAPS: 100.0000 mg | ORAL_CAPSULE | Freq: Three times a day (TID) | ORAL | 0 refills | Status: DC | PRN

## 2017-06-13 MED ORDER — AZITHROMYCIN 250 MG PO TABS: 250.0000 mg | ORAL_TABLET | Freq: Every day | ORAL | 0 refills | Status: DC

## 2017-06-13 MED ORDER — TRIAMCINOLONE ACETONIDE 0.1 % EX CREA: 1.0000 "application " | TOPICAL_CREAM | Freq: Two times a day (BID) | CUTANEOUS | 1 refills | Status: DC

## 2017-06-13 MED ORDER — GABAPENTIN 300 MG PO CAPS: 300.0000 mg | ORAL_CAPSULE | Freq: Every day | ORAL | 2 refills | Status: DC

## 2017-06-13 NOTE — ED Provider Notes (Addendum)
[place]   [id number] 06/13/17 Arrival Time: 1614   SUBJECTIVE:  Alyssa Sullivan is a 36 y.o. female who presents to the urgent care with complaint of scratchy throat, nasal congestion, and bilateral ear discomfort since Friday.  She reports bilateral leg pain for the last year.  She states she feels like her skin is being shredded apart and she can't handle cold water on the skin.  She states that she has hyperreflexia in her knees and has fallen twice in the last month.  Also, feet feel like they are burning.  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 Asthma   . Heel spur   . Plantar fasciitis    History reviewed. No pertinent family history. Social History   Socioeconomic History  . Marital status: Single    Spouse name: Not on file  . Number of children: 0  . Years of education: 21  . Highest education level: Not on file  Social Needs  . Financial resource strain: Not on file  . Food insecurity - worry: Not on file  . Food insecurity - inability: Not on file  . Transportation needs - medical: Not on file  . Transportation needs - non-medical: Not on file  Occupational History  . Occupation: waitress  Tobacco Use  . Smoking status: Current Every Day Smoker    Types: Cigarettes  . Smokeless tobacco: Never Used  Substance and Sexual Activity  . Alcohol use: Yes    Alcohol/week: 0.0 oz    Comment:  wine daily  . Drug use: No  . Sexual activity: Not on file  Other Topics Concern  . Not on file  Social History Narrative  . Not on file   No outpatient medications have been marked as taking for the 06/13/17 encounter [facility] Encounter).   Allergies  Allergen Reactions  . Codeine Hives  . Penicillins Hives    + makes edges of hair fall out.       ROS: As per HPI, remainder of ROS negative.   OBJECTIVE:   Vitals:   06/13/17 1631  BP: (!) 139/97    Pulse: 87  Temp: 98.3 F (36.8 C)  TempSrc: Oral  SpO2: 97%     General appearance: alert; no distress Eyes: PERRL; EOMI; conjunctiva normal HENT: normocephalic; atraumatic; TMs normal, canal normal, external ears normal without trauma; nasal mucosa reddened with diffuse thick discharge; oral mucosa normal Neck: supple Lungs: clear to auscultation bilaterally Heart: regular rate and rhythm Back: no CVA tenderness Extremities: no cyanosis or edema; symmetrical with no gross deformities Skin: warm and dry; keratosis pilaris and lower extremities Neurologic: normal gait; grossly normal; hyperreflexic knees but normal reflexia in upper extremities Psychological: alert and cooperative; normal mood and affect      Labs:  Results for orders placed or performed during the hospital encounter of 10/25/14  CBC with Differential  Result Value Ref Range   WBC 8.4 4.0 - 10.5 K/uL   RBC 4.55 3.87 - 5.11 MIL/uL   Hemoglobin 14.1 12.0 - 15.0 g/dL   HCT 41.9 36.0 - 46.0 %   MCV 92.1 78.0 - 100.0 fL   MCH 31.0 26.0 - 34.0 pg   MCHC 33.7 30.0 - 36.0 g/dL   RDW 13.7 11.5 - 15.5 %   Platelets 278 150 - 400 K/uL   Neutrophils Relative % 69 43 - 77 %  Neutro Abs 5.8 1.7 - 7.7 K/uL   Lymphocytes Relative 23 12 - 46 %   Lymphs Abs 2.0 0.7 - 4.0 K/uL   Monocytes Relative 7 3 - 12 %   Monocytes Absolute 0.6 0.1 - 1.0 K/uL   Eosinophils Relative 1 0 - 5 %   Eosinophils Absolute 0.1 0.0 - 0.7 K/uL   Basophils Relative 0 0 - 1 %   Basophils Absolute 0.0 0.0 - 0.1 K/uL  Comprehensive metabolic panel  Result Value Ref Range   Sodium 132 (L) 135 - 145 mmol/L   Potassium 3.9 3.5 - 5.1 mmol/L   Chloride 100 96 - 112 mmol/L   CO2 24 19 - 32 mmol/L   Glucose, Bld 114 (H) 70 - 99 mg/dL   BUN 7 6 - 23 mg/dL   Creatinine, Ser 0.75 0.50 - 1.10 mg/dL   Calcium 8.7 8.4 - 10.5 mg/dL   Total Protein 7.6 6.0 - 8.3 g/dL   Albumin 3.5 3.5 - 5.2 g/dL   AST 30 0 - 37 U/L   ALT 28 0 - 35 U/L   Alkaline  Phosphatase 59 39 - 117 U/L   Total Bilirubin 0.9 0.3 - 1.2 mg/dL   GFR calc non Af Amer >90 >90 mL/min   GFR calc Af Amer >90 >90 mL/min   Anion gap 8 5 - 15    Labs Reviewed - No data to display  No results found.     ASSESSMENT & PLAN:  1. Acute maxillary sinusitis, recurrence not specified   2. Bronchitis   3. Keratosis pilaris   4. Hyperreflexia     Meds ordered this encounter  Medications  . benzonatate (TESSALON) 100 MG capsule    Sig: Take 1-2 capsules (100-200 mg total) 3 (three) times daily as needed by mouth for cough.    Dispense:  20 capsule    Refill:  0  . azithromycin (ZITHROMAX) 250 MG tablet    Sig: Take 1 tablet (250 mg total) daily by mouth. Take first 2 tablets together, then 1 every day until finished.    Dispense:  6 tablet    Refill:  0  . triamcinolone cream (KENALOG) 0.1 %    Sig: Apply 1 application 2 (two) times daily topically.    Dispense:  453 g    Refill:  1  . gabapentin (NEURONTIN) 300 MG capsule    Sig: Take 1 capsule (300 mg total) at bedtime by mouth.    Dispense:  30 capsule    Refill:  2    Reviewed expectations re: course of current medical issues. Questions answered. Outlined signs and symptoms indicating need for more acute intervention. Patient verbalized understanding. After Visit Summary given.       Robyn Haber, MD 06/13/17 1647    Robyn Haber, MD 06/13/17 [phone]

## 2017-06-13 NOTE — ED Triage Notes (Signed)
Pt states she has been suffering from a scratchy throat, nasal congestion, and bilateral ear discomfort since Friday.  She reports bilateral leg pain for the last year.  She states she feels like her skin is being shredded apart and she can't handle cold water on the skin.

## 2017-06-13 NOTE — Discharge Instructions (Signed)
Please call [facility] for an appointment.

## 2017-08-09 ENCOUNTER — Other Ambulatory Visit: Payer: Self-pay

## 2017-08-09 ENCOUNTER — Encounter (HOSPITAL_COMMUNITY): Payer: Self-pay | Admitting: Emergency Medicine

## 2017-08-09 ENCOUNTER — Ambulatory Visit (HOSPITAL_COMMUNITY)
Admission: EM | Admit: 2017-08-09 | Discharge: 2017-08-09 | Disposition: A | Discharge status: Home / Self Care | Payer: Self-pay | Attending: Family Medicine | Admitting: Family Medicine

## 2017-08-09 DIAGNOSIS — J111 Influenza due to unidentified influenza virus with other respiratory manifestations: Secondary | ICD-10-CM

## 2017-08-09 MED ORDER — HYDROCODONE-HOMATROPINE 5-1.5 MG/5ML PO SYRP: 5.0000 mL | ORAL_SOLUTION | Freq: Four times a day (QID) | ORAL | 0 refills | Status: DC | PRN

## 2017-08-09 MED ORDER — OSELTAMIVIR PHOSPHATE 75 MG PO CAPS: 75.0000 mg | ORAL_CAPSULE | Freq: Two times a day (BID) | ORAL | 0 refills | Status: DC

## 2017-08-09 NOTE — ED Triage Notes (Signed)
Pt states "ive been working three jobs and I overdid it, yesterday my body was achy, and fatigued, and headache and coughing.

## 2017-08-09 NOTE — Discharge Instructions (Signed)

## 2017-08-10 NOTE — ED Provider Notes (Signed)
  [place]   [id number] 08/09/17 Arrival Time: 6759  ASSESSMENT & PLAN:  1. Influenza     Meds ordered this encounter  Medications  . oseltamivir (TAMIFLU) 75 MG capsule    Sig: Take 1 capsule (75 mg total) by mouth every 12 (twelve) hours.    Dispense:  10 capsule    Refill:  0  . HYDROcodone-homatropine (HYCODAN) 5-1.5 MG/5ML syrup    Sig: Take 5 mLs by mouth every 6 (six) hours as needed for cough.    Dispense:  90 mL    Refill:  0   Medication sedation precautions. OTC symptom care as needed. Ensure adequate fluid intake and rest. May f/u with PCP or here as needed.  Reviewed expectations re: course of current medical issues. Questions answered. Outlined signs and symptoms indicating need for more acute intervention. Patient verbalized understanding. After Visit Summary given.   SUBJECTIVE: History from: patient.  Alyssa Sullivan is a 37 y.o. female who presents with complaint of nasal congestion, post-nasal drainage, and a persistent dry cough. Onset abrupt, approximately 1 day ago. Overall fatigued with body aches. SOB: none. Wheezing: none. Fever: yes, subjective. Overall normal PO intake without n/v. Sick contacts: no. OTC treatment: cough medication without relief.  Received flu shot this year: no. Social History   Tobacco Use  Smoking Status Current Every Day Smoker  . Types: Cigarettes  Smokeless Tobacco Never Used    ROS: As per HPI.   OBJECTIVE:  Vitals:   08/09/17 1440  BP: (!) 142/95  Pulse: 89  Resp: 18  Temp: 97.9 F (36.6 C)  TempSrc: Temporal  SpO2: 100%     General appearance: alert; appears fatigued HEENT: nasal congestion; clear runny nose; throat irritation secondary to post-nasal drainage Neck: supple without LAD Lungs: unlabored respirations, symmetrical air entry; cough: moderate; no respiratory distress Skin: warm and dry Psychological: alert and cooperative; normal mood and affect  Allergies  Allergen  Reactions  . Codeine Hives  . Cranberry     Throat closes up  . Penicillins Hives    + makes edges of hair fall out.     Past Medical History:  Diagnosis Date  . Asthma   . Heel spur   . Plantar fasciitis    No family history on file. Social History   Socioeconomic History  . Marital status: Single    Spouse name: Not on file  . Number of children: 0  . Years of education: 59  . Highest education level: Not on file  Social Needs  . Financial resource strain: Not on file  . Food insecurity - worry: Not on file  . Food insecurity - inability: Not on file  . Transportation needs - medical: Not on file  . Transportation needs - non-medical: Not on file  Occupational History  . Occupation: waitress  Tobacco Use  . Smoking status: Current Every Day Smoker    Types: Cigarettes  . Smokeless tobacco: Never Used  Substance and Sexual Activity  . Alcohol use: Yes    Alcohol/week: 0.0 oz    Comment:  wine daily  . Drug use: No  . Sexual activity: Not on file  Other Topics Concern  . Not on file  Social History Narrative  . Not on file           Vanessa Kick, MD 08/10/17 [phone]

## 2017-12-19 ENCOUNTER — Encounter (HOSPITAL_COMMUNITY): Payer: Self-pay | Admitting: Emergency Medicine

## 2017-12-19 ENCOUNTER — Emergency Department (HOSPITAL_COMMUNITY): Payer: Self-pay

## 2017-12-19 ENCOUNTER — Emergency Department (HOSPITAL_COMMUNITY)
Admission: EM | Admit: 2017-12-19 | Discharge: 2017-12-19 | Disposition: A | Discharge status: Home / Self Care | Payer: Self-pay | Attending: Emergency Medicine | Admitting: Emergency Medicine

## 2017-12-19 DIAGNOSIS — Z5321 Procedure and treatment not carried out due to patient leaving prior to being seen by health care provider: Secondary | ICD-10-CM | POA: Insufficient documentation

## 2017-12-19 DIAGNOSIS — R208 Other disturbances of skin sensation: Secondary | ICD-10-CM | POA: Insufficient documentation

## 2017-12-19 DIAGNOSIS — M25562 Pain in left knee: Secondary | ICD-10-CM | POA: Insufficient documentation

## 2017-12-19 NOTE — ED Triage Notes (Signed)
Patient here from home with complaints left knee pain. Denies injury. States that he is unable to "walk correctly". Also reports that she has bilateral "burning" in legs radiating up into right breast.

## 2017-12-19 NOTE — ED Notes (Signed)
No answer when called for fast track. 

## 2017-12-19 NOTE — ED Notes (Signed)
No one responded when called to treatment area

## 2017-12-20 NOTE — ED Notes (Signed)
No answer on follow up call  1050   12/20/17  s ccoble  rn

## 2018-06-26 ENCOUNTER — Encounter (HOSPITAL_COMMUNITY): Payer: Self-pay

## 2018-06-26 ENCOUNTER — Ambulatory Visit (HOSPITAL_COMMUNITY)
Admission: EM | Admit: 2018-06-26 | Discharge: 2018-06-26 | Disposition: A | Discharge status: Home / Self Care | Payer: BLUE CROSS/BLUE SHIELD | Attending: Family Medicine | Admitting: Family Medicine

## 2018-06-26 DIAGNOSIS — R030 Elevated blood-pressure reading, without diagnosis of hypertension: Secondary | ICD-10-CM | POA: Diagnosis not present

## 2018-06-26 DIAGNOSIS — R51 Headache: Secondary | ICD-10-CM

## 2018-06-26 DIAGNOSIS — M79605 Pain in left leg: Secondary | ICD-10-CM

## 2018-06-26 DIAGNOSIS — R519 Headache, unspecified: Secondary | ICD-10-CM

## 2018-06-26 MED ORDER — NAPROXEN 500 MG PO TABS: 500.0000 mg | ORAL_TABLET | Freq: Two times a day (BID) | ORAL | 0 refills | Status: AC

## 2018-06-26 MED ORDER — AMLODIPINE BESYLATE 5 MG PO TABS: 5.0000 mg | ORAL_TABLET | Freq: Every day | ORAL | 0 refills | Status: DC

## 2018-06-26 NOTE — Discharge Instructions (Signed)
Please take the naproxen for 7 days straight and then as needed. You can take the amlodipine for your elevated blood pressure. Please try the exercises for the hamstring and leg. Please see your eye doctor whenever you are able to fully reviewed please try to get more than 6 hours of sleep at night. Please try relaxation techniques to help lower your blood pressure as well. Please follow-up and establish with a regular doctor to take care of these ongoing problems.

## 2018-06-26 NOTE — ED Triage Notes (Signed)
Pt present left leg pain that started last Thursday, pt states pain is very throbbing. Pt present migraine on left side of temple and back of neck started on Thursday

## 2018-06-26 NOTE — ED Provider Notes (Signed)
[place]    CSN: [id number] Arrival date & time: 06/26/18  1819     History   Chief Complaint Chief Complaint  Patient presents with  . Leg Pain  . Migraine    HPI Zeriyah Wain is a 37 y.o. female.   She is presenting with left posterior leg pain, headache, and elevated blood pressure.  Leg pain is acute on chronic in nature.  She has been experiencing this for 6 months.  She denies any inciting event.  She has a history of sciatica but this feels different.  The pain is throbbing in the posterior aspect of her distal thigh and posterior calf.  She denies any history of blood clots.  She denies any air travel in the past 6 months.  She also has sharp great toe pain on the left.  Headache is occurring in the frontal region as well as the posterior aspect of the head.  It is located in the occipital bone.  She reports getting around 6 hours of sleep per night and she is under increased stress with her job.  She reports having blurry vision.  She has not used her glasses in some time has not been to an eye doctor.  She feels like both of her eyes are blurry.  She has not taken anything for the headache.  Feels like the headache is staying the same and occurs on a daily basis.  She also reports having elevated blood pressure here recently.  She denies any history of blood pressure problems.  She denies any shortness of breath or chest pain.  She has not taken anything previously for blood pressure.  HPI  Past Medical History:  Diagnosis Date  . Asthma   . Heel spur   . Plantar fasciitis     Patient Active Problem List   Diagnosis Date Noted  . Chronic hip pain 02/21/2013  . Low back pain with sciatica 02/21/2013    Past Surgical History:  Procedure Laterality Date  . MOUTH SURGERY      OB History   None      Home Medications    Prior to Admission medications   Medication Sig Start Date End Date Taking? Authorizing Provider  amLODipine  (NORVASC) 5 MG tablet Take 1 tablet (5 mg total) by mouth daily. 06/26/18   Rosemarie Ax, MD  azithromycin (ZITHROMAX) 250 MG tablet Take 1 tablet (250 mg total) daily by mouth. Take first 2 tablets together, then 1 every day until finished. Patient not taking: Reported on 08/09/2017 06/13/17   Robyn Haber, MD  benzonatate (TESSALON) 100 MG capsule Take 1-2 capsules (100-200 mg total) 3 (three) times daily as needed by mouth for cough. Patient not taking: Reported on 08/09/2017 06/13/17   Robyn Haber, MD  gabapentin (NEURONTIN) 300 MG capsule Take 1 capsule (300 mg total) at bedtime by mouth. Patient not taking: Reported on 08/09/2017 06/13/17   Robyn Haber, MD  HYDROcodone-homatropine [facility]) 5-1.5 MG/5ML syrup Take 5 mLs by mouth every 6 (six) hours as needed for cough. 08/09/17   Vanessa Kick, MD  naproxen (NAPROSYN) 500 MG tablet Take 1 tablet (500 mg total) by mouth 2 (two) times daily with a meal. 06/26/18 06/26/19  Rosemarie Ax, MD  Olopatadine HCl (PATADAY) 0.2 % SOLN Apply 1 drop to eye daily. Patient not taking: Reported on 08/09/2017 11/24/16   Robyn Haber, MD  oseltamivir (TAMIFLU) 75 MG capsule Take 1 capsule (75 mg total) by mouth every 12 (  twelve) hours. 08/09/17   Vanessa Kick, MD  triamcinolone cream (KENALOG) 0.1 % Apply 1 application 2 (two) times daily topically. Patient not taking: Reported on 08/09/2017 06/13/17   Robyn Haber, MD    Family History History reviewed. No pertinent family history.  Social History Social History   Tobacco Use  . Smoking status: Current Every Day Smoker    Types: Cigarettes  . Smokeless tobacco: Never Used  Substance Use Topics  . Alcohol use: Yes    Comment:  wine daily  . Drug use: No     Allergies   Codeine; Cranberry; and Penicillins   Review of Systems Review of Systems  Constitutional: Negative for fever.  HENT: Negative for congestion.   Eyes: Positive for visual disturbance.  Respiratory:  Negative for cough.   Cardiovascular: Negative for chest pain.  Gastrointestinal: Negative for abdominal pain.  Musculoskeletal: Negative for joint swelling.  Skin: Negative for color change.  Neurological: Positive for headaches.  Hematological: Negative for adenopathy.  Psychiatric/Behavioral: Negative for agitation.     Physical Exam Triage Vital Signs ED Triage Vitals  Enc Vitals Group     BP 06/26/18 1922 (!) 143/93     Pulse Rate 06/26/18 1922 98     Resp --      Temp 06/26/18 1922 98.3 F (36.8 C)     Temp Source 06/26/18 1922 Oral     SpO2 06/26/18 1922 99 %     Weight --      Height --      Head Circumference --      Peak Flow --      Pain Score 06/26/18 1924 8     Pain Loc --      Pain Edu? --      Excl. in [place]? --    No data found.  Updated Vital Signs BP (!) 143/93 (BP Location: Left Arm)   Pulse 98   Temp 98.3 F (36.8 C) (Oral)   SpO2 99%   Visual Acuity Right Eye Distance:   Left Eye Distance:   Bilateral Distance:    Right Eye Near:   Left Eye Near:    Bilateral Near:     Physical Exam Gen: NAD, alert, cooperative with exam, well-appearing ENT: normal lips, normal nasal mucosa,  Eye: normal EOM, normal conjunctiva and lids CV:  no edema, +2 pedal pulses, S1-S2 Resp: no accessory muscle use, non-labored, clear to auscultation bilaterally Skin: no rashes, no areas of induration  Neuro: normal tone, normal sensation to touch Psych:  normal insight, alert and oriented MSK:  Left leg: Normal internal and external rotation of the hip. Normal knee flexion. Knee extension is limited due to hamstring tightness. Normal strength resistance with knee flexion extension. Normal plantar flexion and dorsiflexion. Negative straight leg raise Neurovascularly intact  UC Treatments / Results  Labs (all labs ordered are listed, but only abnormal results are displayed) Labs Reviewed - No data to display  EKG None  Radiology No results  found.  Procedures Procedures (including critical care time)  Medications Ordered in UC Medications - No data to display  Initial Impression / Assessment and Plan / UC Course  I have reviewed the triage vital signs and the nursing notes.  Pertinent labs & imaging results that were available during my care of the patient were reviewed by me and considered in my medical decision making (see chart for details).    Yasamin is a 37 year old female is presenting with elevated blood pressure,  left leg pain, and headache.  Elevated blood pressure could be associated with her pain versus stress versus lack of sleep.  I will provide her with amlodipine and she has follow-up with a regular doctor next week.  The left leg pain seems to be associated with tightness of the hamstrings.  Does not appear to be associated with sciatica.  Counseled on home exercise therapy and supportive care.  Also provided with some naproxen.  If no improvement can consider physical therapy.  The headache seems to be a 2 part component.  The frontal headache could represent more of a migraine or associated with her vision changes.  She has not been to the eye doctor in some time.  Advised that she get seen by the eye doctor.  The pain that she feels in the posterior aspect could be more associated with stress.  Counseled on relaxation techniques.  Given indications to follow-up.  Final Clinical Impressions(s) / UC Diagnoses   Final diagnoses:  Acute nonintractable headache, unspecified headache type  Left leg pain  Elevated BP without diagnosis of hypertension     Discharge Instructions     Please take the naproxen for 7 days straight and then as needed. You can take the amlodipine for your elevated blood pressure. Please try the exercises for the hamstring and leg. Please see your eye doctor whenever you are able to fully reviewed please try to get more than 6 hours of sleep at night. Please try relaxation techniques  to help lower your blood pressure as well. Please follow-up and establish with a regular doctor to take care of these ongoing problems.    ED Prescriptions    Medication Sig Dispense Auth. Provider   amLODipine (NORVASC) 5 MG tablet Take 1 tablet (5 mg total) by mouth daily. 30 tablet Rosemarie Ax, MD   naproxen (NAPROSYN) 500 MG tablet Take 1 tablet (500 mg total) by mouth 2 (two) times daily with a meal. 30 tablet Rosemarie Ax, MD     Controlled Substance Prescriptions [place] Controlled Substance Registry consulted? Not Applicable   Rosemarie Ax, MD 06/26/18 2102

## 2018-06-27 MED FILL — AMLODIPINE BESYLATE 5 MG TA: 5 | 30 days supply | Qty: 30 | Fill #0

## 2018-06-27 MED FILL — NAPROXEN 500 MG TABLET: 500 | 15 days supply | Qty: 30 | Fill #0

## 2018-09-26 DIAGNOSIS — I10 Essential (primary) hypertension: Secondary | ICD-10-CM | POA: Diagnosis not present

## 2018-09-26 DIAGNOSIS — F17210 Nicotine dependence, cigarettes, uncomplicated: Secondary | ICD-10-CM | POA: Diagnosis not present

## 2018-09-26 DIAGNOSIS — J209 Acute bronchitis, unspecified: Secondary | ICD-10-CM | POA: Diagnosis not present

## 2018-09-26 DIAGNOSIS — J019 Acute sinusitis, unspecified: Secondary | ICD-10-CM | POA: Diagnosis not present

## 2018-09-26 DIAGNOSIS — Z13220 Encounter for screening for lipoid disorders: Secondary | ICD-10-CM | POA: Diagnosis not present

## 2018-09-26 DIAGNOSIS — Z131 Encounter for screening for diabetes mellitus: Secondary | ICD-10-CM | POA: Diagnosis not present

## 2018-09-26 DIAGNOSIS — Z716 Tobacco abuse counseling: Secondary | ICD-10-CM | POA: Diagnosis not present

## 2018-09-26 MED FILL — VENTOLIN HFA 90 MCG INHALER: 108 (90 BAS | 25 days supply | Qty: 18 | Fill #0

## 2018-09-26 MED FILL — BENZONATATE 100 MG CAPS: 100 | 10 days supply | Qty: 30 | Fill #0

## 2018-09-26 MED FILL — predniSONE 20 MG TABS: 20 | 7 days supply | Qty: 10 | Fill #0

## 2018-09-26 MED FILL — AMLODIPINE BESYLATE 5 MG TA: 5 | 30 days supply | Qty: 30 | Fill #0

## 2018-09-26 MED FILL — AZITHROMYCIN 250 MG TABS: 250 | 5 days supply | Qty: 6 | Fill #0

## 2018-10-08 MED FILL — VIT D2 1.25 MG (50,000 UNIT: 1.25 MG | 28 days supply | Qty: 4 | Fill #0

## 2018-10-10 DIAGNOSIS — J4520 Mild intermittent asthma, uncomplicated: Secondary | ICD-10-CM | POA: Diagnosis not present

## 2018-10-10 DIAGNOSIS — E669 Obesity, unspecified: Secondary | ICD-10-CM | POA: Diagnosis not present

## 2018-10-10 DIAGNOSIS — Z716 Tobacco abuse counseling: Secondary | ICD-10-CM | POA: Diagnosis not present

## 2018-10-10 DIAGNOSIS — J302 Other seasonal allergic rhinitis: Secondary | ICD-10-CM | POA: Diagnosis not present

## 2018-10-10 DIAGNOSIS — F17210 Nicotine dependence, cigarettes, uncomplicated: Secondary | ICD-10-CM | POA: Diagnosis not present

## 2018-10-10 DIAGNOSIS — I10 Essential (primary) hypertension: Secondary | ICD-10-CM | POA: Diagnosis not present

## 2018-10-15 ENCOUNTER — Encounter (HOSPITAL_COMMUNITY): Payer: Self-pay | Admitting: Emergency Medicine

## 2018-10-15 ENCOUNTER — Other Ambulatory Visit: Payer: Self-pay

## 2018-10-15 ENCOUNTER — Ambulatory Visit (HOSPITAL_COMMUNITY)
Admission: EM | Admit: 2018-10-15 | Discharge: 2018-10-15 | Disposition: A | Discharge status: Home / Self Care | Payer: BLUE CROSS/BLUE SHIELD | Attending: Family Medicine | Admitting: Family Medicine

## 2018-10-15 DIAGNOSIS — T783XXA Angioneurotic edema, initial encounter: Secondary | ICD-10-CM

## 2018-10-15 MED ORDER — DEXAMETHASONE SODIUM PHOSPHATE 10 MG/ML IJ SOLN
10.0000 mg | Freq: Once | INTRAMUSCULAR | Status: AC
  Administered 2018-10-15: 10 mg via INTRAMUSCULAR

## 2018-10-15 MED ORDER — DEXAMETHASONE SODIUM PHOSPHATE 10 MG/ML IJ SOLN
INTRAMUSCULAR | Status: AC
  Filled 2018-10-15: qty 1

## 2018-10-15 NOTE — Discharge Instructions (Signed)
If you feel this is worsening over the next 24 hours please proceed directly to the Emergency Department.  Call your doctor tomorrow to inform him or her of your symptoms and to ask about alternative medications to treat your blood pressure.

## 2018-10-15 NOTE — ED Triage Notes (Signed)
Pt here for tongue and lip swelling; pt sts has just started taking bystolic

## 2018-10-16 MED FILL — HYDROCHLOROTHIAZIDE 12.5 MG: 12.5 | 30 days supply | Qty: 30 | Fill #0 | Status: TO

## 2018-10-17 NOTE — ED Provider Notes (Signed)
[place]   [id number] 10/15/18 Arrival Time: 3893  ASSESSMENT & PLAN:  1. Angioedema, initial encounter    No respiratory distress.  Suspect related to new BP medication which has been stopped as of two days ago.  Meds ordered this encounter  Medications  . dexamethasone (DECADRON) injection 10 mg   Strict f/u precautions discussed: agrees to proceed to the ED with any worsening of symptoms, esp related to swallowing/breathings. She voices understanding. Desires trial of Decadron IM and observation.  Follow-up Information    [place].   Specialty:  Emergency Medicine Why:  If symptoms worsen in any way. Contact information: [address] [id number] [place] [phone]          Reviewed expectations re: course of current medical issues. Questions answered. Outlined signs and symptoms indicating need for more acute intervention. Patient verbalized understanding. After Visit Summary given.   SUBJECTIVE: History from: patient. Alyssa Sullivan is a 38 y.o. female who presents with complaint of gradual tongue and lip swelling beginning 2-3 days ago. Started shortly after beginning a new BP medication: Bystolic. She stopped taking once the swelling started. Swelling has progressed to a point and has plateaued. No respiratory or swallowing difficulties. She is sticking to softer foods; no trouble with liquid intake. No neck pain or swelling. No SOB/wheezing/n/v. Ambulatory without difficulty. No h/o similar. No OTC/home tx reported.  ROS: As per HPI. All other systems negative.   OBJECTIVE:  Vitals:   10/15/18 1202  BP: (!) 137/99  Pulse: 88  Resp: 18  Temp: 98.1 F (36.7 C)  TempSrc: Temporal  SpO2: 100%    General appearance: alert; no distress Eyes: PERRLA; EOMI; conjunctiva normal HENT: normocephalic; atraumatic; upper/lower lips along with tongue with  obvious swelling; able to view posterior pharynx which shows no abnormalities; TMs normal; nasal mucosa normal; oral mucosa appears normal Neck: supple  Lungs: clear to auscultation bilaterally; unlabored Heart: regular rate and rhythm without murmer Abdomen: soft, non-tender; bowel sounds normal; no masses or organomegaly; no guarding or rebound tenderness Back: no CVA tenderness Extremities: no edema; symmetrical with no gross deformities Skin: warm and dry Neurologic: normal gait; normal symmetric reflexes Psychological: alert and cooperative; normal mood and affect  Allergies  Allergen Reactions  . Codeine Hives  . Cranberry     Throat closes up  . Penicillins Hives    + makes edges of hair fall out.     Past Medical History:  Diagnosis Date  . Asthma   . Heel spur   . Plantar fasciitis    Social History   Socioeconomic History  . Marital status: Single    Spouse name: Not on file  . Number of children: 0  . Years of education: 51  . Highest education level: Not on file  Occupational History  . Occupation: Proofreader  . Financial resource strain: Not on file  . Food insecurity:    Worry: Not on file    Inability: Not on file  . Transportation needs:    Medical: Not on file    Non-medical: Not on file  Tobacco Use  . Smoking status: Current Every Day Smoker    Types: Cigarettes  . Smokeless tobacco: Never Used  Substance and Sexual Activity  . Alcohol use: Yes    Comment:  wine daily  . Drug use: No  . Sexual activity: Not on file  Lifestyle  . Physical activity:  Days per week: Not on file    Minutes per session: Not on file  . Stress: Not on file  Relationships  . Social connections:    Talks on phone: Not on file    Gets together: Not on file    Attends religious service: Not on file    Active member of club or organization: Not on file    Attends meetings of clubs or organizations: Not on file    Relationship status: Not on file   . Intimate partner violence:    Fear of current or ex partner: Not on file    Emotionally abused: Not on file    Physically abused: Not on file    Forced sexual activity: Not on file  Other Topics Concern  . Not on file  Social History Narrative  . Not on file    Past Surgical History:  Procedure Laterality Date  . MOUTH SURGERY       Vanessa Kick, MD 10/17/18 [phone]

## 2018-11-14 DIAGNOSIS — J4520 Mild intermittent asthma, uncomplicated: Secondary | ICD-10-CM | POA: Diagnosis not present

## 2018-11-14 DIAGNOSIS — R252 Cramp and spasm: Secondary | ICD-10-CM | POA: Diagnosis not present

## 2018-11-14 DIAGNOSIS — I10 Essential (primary) hypertension: Secondary | ICD-10-CM | POA: Diagnosis not present

## 2019-02-20 MED FILL — HYDROCHLOROTHIAZIDE 12.5 MG: 12.5 | 30 days supply | Qty: 30 | Fill #0

## 2019-02-20 MED FILL — POTASSIUM CHLORIDE CRYS ER: 10 | 30 days supply | Qty: 30 | Fill #0

## 2019-02-21 ENCOUNTER — Other Ambulatory Visit: Payer: Self-pay

## 2019-02-21 ENCOUNTER — Encounter (HOSPITAL_COMMUNITY): Payer: Self-pay

## 2019-02-21 ENCOUNTER — Emergency Department (HOSPITAL_COMMUNITY)
Admission: EM | Admit: 2019-02-21 | Discharge: 2019-02-21 | Disposition: A | Discharge status: Home / Self Care | Payer: BC Managed Care – PPO | Attending: Emergency Medicine | Admitting: Emergency Medicine

## 2019-02-21 DIAGNOSIS — Z79899 Other long term (current) drug therapy: Secondary | ICD-10-CM | POA: Insufficient documentation

## 2019-02-21 DIAGNOSIS — F17210 Nicotine dependence, cigarettes, uncomplicated: Secondary | ICD-10-CM | POA: Insufficient documentation

## 2019-02-21 DIAGNOSIS — Z880 Allergy status to penicillin: Secondary | ICD-10-CM | POA: Diagnosis not present

## 2019-02-21 DIAGNOSIS — J45909 Unspecified asthma, uncomplicated: Secondary | ICD-10-CM | POA: Diagnosis not present

## 2019-02-21 DIAGNOSIS — I10 Essential (primary) hypertension: Secondary | ICD-10-CM | POA: Diagnosis not present

## 2019-02-21 HISTORY — DX: Essential (primary) hypertension: I10

## 2019-02-21 NOTE — ED Triage Notes (Signed)
Pt states that she has been out of her BP meds for "a while". Pt states she was able to pick them up yesterday. Pt states that she had talked on the phone with the nurse, who told her to come in if her BP was high today (178/118).  Pt states that she has a headache as well.

## 2019-02-21 NOTE — ED Provider Notes (Signed)
[place] DEPT Provider Note   CSN: [id number] Arrival date & time: 02/21/19  4166     History   Chief Complaint Chief Complaint  Patient presents with  . Hypertension    HPI Alyssa Sullivan is a 38 y.o. female.     The history is provided by the patient and medical records. No language interpreter was used.  Hypertension     38 year old female with history of hypertension presenting with concerns of uncontrolled blood pressure.  Patient reports she was diagnosed with high blood pressure in April.  She is currently on a blood pressure medication she believes he has hydrochlorothiazide.  She was without her blood pressure medication for several weeks due to inability to follow-up with her PCP.  She finally was able to have her medication refilled yesterday.  However last night, she noticed that her blood pressure was elevated at 063 systolic.  She was concerned, contact her PCP and spoke with the nurse who recommend patient to come to the ER.  Patient also endorsed mild throbbing headache for the past 2 to 3 days.  She does not complain of any vision changes, slurring of speech, focal numbness weakness, chest pain shortness of breath or fever.  She admits to using tobacco.  She has no other concern.  Past Medical History:  Diagnosis Date  . Asthma   . Heel spur   . Hypertension   . Plantar fasciitis     Patient Active Problem List   Diagnosis Date Noted  . Chronic hip pain 02/21/2013  . Low back pain with sciatica 02/21/2013    Past Surgical History:  Procedure Laterality Date  . MOUTH SURGERY       OB History   No obstetric history on file.      Home Medications    Prior to Admission medications   Medication Sig Start Date End Date Taking? Authorizing Provider  amLODipine (NORVASC) 5 MG tablet Take 1 tablet (5 mg total) by mouth daily. Patient not taking: Reported on 10/15/2018 06/26/18   Rosemarie Ax, MD   azithromycin (ZITHROMAX) 250 MG tablet Take 1 tablet (250 mg total) daily by mouth. Take first 2 tablets together, then 1 every day until finished. Patient not taking: Reported on 08/09/2017 06/13/17   Robyn Haber, MD  benzonatate (TESSALON) 100 MG capsule Take 1-2 capsules (100-200 mg total) 3 (three) times daily as needed by mouth for cough. Patient not taking: Reported on 08/09/2017 06/13/17   Robyn Haber, MD  gabapentin (NEURONTIN) 300 MG capsule Take 1 capsule (300 mg total) at bedtime by mouth. Patient not taking: Reported on 08/09/2017 06/13/17   Robyn Haber, MD  HYDROcodone-homatropine [facility]) 5-1.5 MG/5ML syrup Take 5 mLs by mouth every 6 (six) hours as needed for cough. 08/09/17   Vanessa Kick, MD  naproxen (NAPROSYN) 500 MG tablet Take 1 tablet (500 mg total) by mouth 2 (two) times daily with a meal. 06/26/18 06/26/19  Rosemarie Ax, MD  Olopatadine HCl (PATADAY) 0.2 % SOLN Apply 1 drop to eye daily. Patient not taking: Reported on 08/09/2017 11/24/16   Robyn Haber, MD  oseltamivir (TAMIFLU) 75 MG capsule Take 1 capsule (75 mg total) by mouth every 12 (twelve) hours. Patient not taking: Reported on 10/15/2018 08/09/17   Vanessa Kick, MD  triamcinolone cream (KENALOG) 0.1 % Apply 1 application 2 (two) times daily topically. Patient not taking: Reported on 08/09/2017 06/13/17   Robyn Haber, MD    Family History No family history  on file.  Social History Social History   Tobacco Use  . Smoking status: Current Every Day Smoker    Types: Cigarettes  . Smokeless tobacco: Never Used  Substance Use Topics  . Alcohol use: Yes    Comment:  wine daily  . Drug use: No     Allergies   Codeine, Cranberry, Fig extract [ficus], and Penicillins   Review of Systems Review of Systems  All other systems reviewed and are negative.    Physical Exam Updated Vital Signs BP (!) 138/96 (BP Location: Right Arm)   Pulse 80   Temp 98.2 F (36.8 C) (Oral)   Resp 19    Wt 113 kg   LMP 02/17/2019   SpO2 100%   BMI 37.33 kg/m   Physical Exam Vitals signs and nursing note reviewed.  Constitutional:      General: She is not in acute distress.    Appearance: She is well-developed.  HENT:     Head: Atraumatic.  Eyes:     Conjunctiva/sclera: Conjunctivae normal.  Neck:     Musculoskeletal: Neck supple. No neck rigidity.     Vascular: No carotid bruit.  Cardiovascular:     Rate and Rhythm: Normal rate and regular rhythm.     Pulses: Normal pulses.     Heart sounds: Normal heart sounds.  Pulmonary:     Breath sounds: Normal breath sounds.  Abdominal:     Palpations: Abdomen is soft.     Tenderness: There is no abdominal tenderness.  Skin:    Findings: No rash.  Neurological:     Mental Status: She is alert and oriented to person, place, and time.     GCS: GCS eye subscore is 4. GCS verbal subscore is 5. GCS motor subscore is 6.     Cranial Nerves: Cranial nerves are intact.      ED Treatments / Results  Labs (all labs ordered are listed, but only abnormal results are displayed) Labs Reviewed - No data to display  EKG EKG Interpretation  Date/Time:  Thursday February 21 2019 09:43:10 EDT Ventricular Rate:  80 PR Interval:    QRS Duration: 89 QT Interval:  378 QTC Calculation: 436 R Axis:   48 Text Interpretation:  Sinus rhythm No old tracing to compare Confirmed by Daleen Bo [id number]) on 02/21/2019 9:54:31 AM   Radiology No results found.  Procedures Procedures (including critical care time)  Medications Ordered in ED Medications - No data to display   Initial Impression / Assessment and Plan / ED Course  I have reviewed the triage vital signs and the nursing notes.  Pertinent labs & imaging results that were available during my care of the patient were reviewed by me and considered in my medical decision making (see chart for details).        BP (!) 138/96 (BP Location: Right Arm)   Pulse 80   Temp 98.2 F (36.8 C)  (Oral)   Resp 19   Wt 113 kg   LMP 02/17/2019   SpO2 100%   BMI 37.33 kg/m    Final Clinical Impressions(s) / ED Diagnoses   Final diagnoses:  Essential hypertension    ED Discharge Orders    None     9:53 AM Patient presents concerning of high blood pressure.  She recently resumed taking her blood pressure medication last night after being without it for several weeks.  She mention her last blood pressure was 149 systolic.  She endorsed mild throbbing headache.  She has no red flags in regard to her headache.  I have low suspicion for stroke, meningitis, or sub-arachnoid hemorrhage.  She is well-appearing in no acute discomfort.  Blood pressure today is 138/96.  I encourage patient to continue with her blood pressure medication.  I also encouraged smoking cessation as well as dietary changes to aid with better blood pressure control.  Otherwise, she is stable for discharge.  Encourage patient to follow-up with PCP for further care.   Domenic Moras, PA-C 02/21/19 3735    Daleen Bo, MD 02/21/19 1630

## 2019-02-24 DIAGNOSIS — Z0130 Encounter for examination of blood pressure without abnormal findings: Secondary | ICD-10-CM | POA: Diagnosis not present

## 2019-02-24 DIAGNOSIS — Z1159 Encounter for screening for other viral diseases: Secondary | ICD-10-CM | POA: Diagnosis not present

## 2019-06-17 MED FILL — HYDROCHLOROTHIAZIDE 12.5 MG: 12.5 | 30 days supply | Qty: 30 | Fill #1

## 2019-07-10 MED FILL — HYDROCHLOROTHIAZIDE 12.5 MG: 12.5 | 30 days supply | Qty: 30 | Fill #1

## 2019-09-25 MED FILL — HYDROCHLOROTHIAZIDE 12.5 MG: 12.5 | 30 days supply | Qty: 30 | Fill #0

## 2019-10-18 ENCOUNTER — Encounter (HOSPITAL_COMMUNITY): Payer: Self-pay | Admitting: Emergency Medicine

## 2019-10-18 ENCOUNTER — Emergency Department (HOSPITAL_COMMUNITY): Payer: Self-pay

## 2019-10-18 ENCOUNTER — Emergency Department (HOSPITAL_COMMUNITY)
Admission: EM | Admit: 2019-10-18 | Discharge: 2019-10-18 | Disposition: A | Discharge status: Home / Self Care | Payer: Self-pay | Attending: Emergency Medicine | Admitting: Emergency Medicine

## 2019-10-18 ENCOUNTER — Other Ambulatory Visit: Payer: Self-pay

## 2019-10-18 DIAGNOSIS — R202 Paresthesia of skin: Secondary | ICD-10-CM | POA: Insufficient documentation

## 2019-10-18 DIAGNOSIS — F17210 Nicotine dependence, cigarettes, uncomplicated: Secondary | ICD-10-CM | POA: Insufficient documentation

## 2019-10-18 DIAGNOSIS — G629 Polyneuropathy, unspecified: Secondary | ICD-10-CM | POA: Insufficient documentation

## 2019-10-18 DIAGNOSIS — J45909 Unspecified asthma, uncomplicated: Secondary | ICD-10-CM | POA: Insufficient documentation

## 2019-10-18 DIAGNOSIS — Z79899 Other long term (current) drug therapy: Secondary | ICD-10-CM | POA: Insufficient documentation

## 2019-10-18 DIAGNOSIS — I10 Essential (primary) hypertension: Secondary | ICD-10-CM | POA: Insufficient documentation

## 2019-10-18 LAB — CBC WITH DIFFERENTIAL/PLATELET
Abs Immature Granulocytes: 0.01 10*3/uL (ref 0.00–0.07)
Basophils Absolute: 0 10*3/uL (ref 0.0–0.1)
Basophils Relative: 1 %
Eosinophils Absolute: 0.1 10*3/uL (ref 0.0–0.5)
Eosinophils Relative: 3 %
HCT: 42.4 % (ref 36.0–46.0)
Hemoglobin: 13.6 g/dL (ref 12.0–15.0)
Immature Granulocytes: 0 %
Lymphocytes Relative: 40 %
Lymphs Abs: 1.7 10*3/uL (ref 0.7–4.0)
MCH: 30.8 pg (ref 26.0–34.0)
MCHC: 32.1 g/dL (ref 30.0–36.0)
MCV: 96.1 fL (ref 80.0–100.0)
Monocytes Absolute: 0.4 10*3/uL (ref 0.1–1.0)
Monocytes Relative: 10 %
Neutro Abs: 1.9 10*3/uL (ref 1.7–7.7)
Neutrophils Relative %: 46 %
Platelets: 248 10*3/uL (ref 150–400)
RBC: 4.41 MIL/uL (ref 3.87–5.11)
RDW: 13.3 % (ref 11.5–15.5)
WBC: 4.1 10*3/uL (ref 4.0–10.5)
nRBC: 0 % (ref 0.0–0.2)

## 2019-10-18 LAB — TSH: TSH: 1.147 u[IU]/mL (ref 0.350–4.500)

## 2019-10-18 LAB — BASIC METABOLIC PANEL
Anion gap: 10 (ref 5–15)
BUN: 13 mg/dL (ref 6–20)
CO2: 24 mmol/L (ref 22–32)
Calcium: 8.9 mg/dL (ref 8.9–10.3)
Chloride: 104 mmol/L (ref 98–111)
Creatinine, Ser: 0.74 mg/dL (ref 0.44–1.00)
GFR calc Af Amer: >60 mL/min (ref >60)
GFR calc non Af Amer: >60 mL/min (ref >60)
Glucose, Bld: 98 mg/dL (ref 70–99)
Potassium: 4.2 mmol/L (ref 3.5–5.1)
Sodium: 138 mmol/L (ref 135–145)

## 2019-10-18 LAB — I-STAT BETA HCG BLOOD, ED (MC, WL, AP ONLY): I-stat hCG, quantitative: <5 m[IU]/mL (ref <5)

## 2019-10-18 LAB — VITAMIN B12: Vitamin B-12: 361 pg/mL (ref 180–914)

## 2019-10-18 MED ORDER — GABAPENTIN 100 MG PO CAPS: 100.0000 mg | ORAL_CAPSULE | Freq: Three times a day (TID) | ORAL | 0 refills | Status: DC

## 2019-10-18 NOTE — ED Notes (Signed)
Off floor for testing 

## 2019-10-18 NOTE — ED Provider Notes (Signed)
[facility] DEPT Provider Note   CSN: [id number] Arrival date & time: 10/18/19  1048   History Chief Complaint  Patient presents with  . Foot Pain   Alyssa Sullivan is a 39 y.o. female with past medical history significant for plantar fasciitis, asthma, hypertension, chronic low back pain as well as chronic hip pain who presents for evaluation multiple complaints.  Patient states she has been having generalized fatigue, body aches, polydipsia over the last few months.  Patient states she is also having intermittent paresthesias to her lower extremities worse at night.  Does have a history of chronic alcohol use drinks approximately 3-4 glasses of wine nightly. No prior DT or WD seizures. Admits to multiple falls including 2 days where she hit her head. Has had HA since. No midline neck pain. Denies syncope. No ascending paralysis. Does have some mild midline back pain. Has not been evaluated for this previously. Symptoms occurring x months. No vision changes, dizziness, lightheadedness, CP, SOB, abd pain, hx DM, swelling, redness, warmth, temp change to extremities. Has not taken anything for her symptoms. No facial droop, difficulty word finding, unilateral weakness.   History obtained from patient and past medical records. No interpretor was used.  HPI     Past Medical History:  Diagnosis Date  . Asthma   . Heel spur   . Hypertension   . Plantar fasciitis     Patient Active Problem List   Diagnosis Date Noted  . Chronic hip pain 02/21/2013  . Low back pain with sciatica 02/21/2013    Past Surgical History:  Procedure Laterality Date  . MOUTH SURGERY       OB History   No obstetric history on file.     No family history on file.  Social History   Tobacco Use  . Smoking status: Current Every Day Smoker    Types: Cigarettes  . Smokeless tobacco: Never Used  Substance Use Topics  . Alcohol use: Yes    Comment:  wine daily  . Drug  use: No    Home Medications Prior to Admission medications   Medication Sig Start Date End Date Taking? Authorizing Provider  amLODipine (NORVASC) 5 MG tablet Take 1 tablet (5 mg total) by mouth daily. Patient not taking: Reported on 10/15/2018 06/26/18   Rosemarie Ax, MD  azithromycin (ZITHROMAX) 250 MG tablet Take 1 tablet (250 mg total) daily by mouth. Take first 2 tablets together, then 1 every day until finished. Patient not taking: Reported on 08/09/2017 06/13/17   Robyn Haber, MD  benzonatate (TESSALON) 100 MG capsule Take 1-2 capsules (100-200 mg total) 3 (three) times daily as needed by mouth for cough. Patient not taking: Reported on 08/09/2017 06/13/17   Robyn Haber, MD  gabapentin (NEURONTIN) 100 MG capsule Take 1 capsule (100 mg total) by mouth 3 (three) times daily. 10/18/19 11/17/19  Deyonte Cadden A, PA-C  HYDROcodone-homatropine (HYCODAN) 5-1.5 MG/5ML syrup Take 5 mLs by mouth every 6 (six) hours as needed for cough. 08/09/17   Vanessa Kick, MD  Olopatadine HCl (PATADAY) 0.2 % SOLN Apply 1 drop to eye daily. Patient not taking: Reported on 08/09/2017 11/24/16   Robyn Haber, MD  oseltamivir (TAMIFLU) 75 MG capsule Take 1 capsule (75 mg total) by mouth every 12 (twelve) hours. Patient not taking: Reported on 10/15/2018 08/09/17   Vanessa Kick, MD  triamcinolone cream (KENALOG) 0.1 % Apply 1 application 2 (two) times daily topically. Patient not taking: Reported on 08/09/2017 06/13/17  Robyn Haber, MD    Allergies    Codeine, Cranberry, Fig extract [ficus], and Penicillins  Review of Systems   Review of Systems  Constitutional: Positive for fatigue.  HENT: Negative.   Eyes: Negative.   Respiratory: Negative.   Cardiovascular: Negative.   Gastrointestinal: Negative.   Endocrine: Positive for polydipsia.  Genitourinary: Negative.   Musculoskeletal: Positive for back pain. Negative for arthralgias, gait problem, joint swelling, myalgias, neck pain and neck  stiffness.  Skin: Negative.   Neurological: Positive for weakness (Generalized), numbness and headaches. Negative for dizziness, tremors, seizures, syncope, facial asymmetry, speech difficulty and light-headedness.  All other systems reviewed and are negative.   Physical Exam Updated Vital Signs BP (!) 130/92 (BP Location: Left Arm)   Pulse 93   Temp 99 F (37.2 C) (Oral)   Resp 16   Ht 5\' 8"  (1.727 m)   Wt 79.4 kg   SpO2 100%   BMI 26.61 kg/m   Physical Exam Physical Exam  Constitutional: Pt is oriented to person, place, and time. Pt appears well-developed and well-nourished. No distress.  HENT:  Head: Normocephalic and atraumatic.  Mouth/Throat: Oropharynx is clear and moist.  Eyes: Conjunctivae and EOM are normal. Pupils are equal, round, and reactive to light. No scleral icterus.  No horizontal, vertical or rotational nystagmus  Neck: Normal range of motion. Neck supple.  Full active and passive ROM without pain No midline or paraspinal tenderness No nuchal rigidity or meningeal signs  Cardiovascular: Normal rate, regular rhythm and intact distal pulses.   Pulmonary/Chest: Effort normal and breath sounds normal. No respiratory distress. Pt has no wheezes. No rales.  Abdominal: Soft. Bowel sounds are normal. There is no tenderness. There is no rebound and no guarding.  Musculoskeletal: Normal range of motion.  Lymphadenopathy:    No cervical adenopathy.  Neurological: Pt. is alert and oriented to person, place, and time. He has normal reflexes. No cranial nerve deficit.  Exhibits normal muscle tone. Coordination normal.  Mental Status:  Alert, oriented, thought content appropriate. Speech fluent without evidence of aphasia. Able to follow 2 step commands without difficulty.  Cranial Nerves:  II:  Peripheral visual fields grossly normal, pupils equal, round, reactive to light III,IV, VI: ptosis not present, extra-ocular motions intact bilaterally  V,VII: smile symmetric,  facial light touch sensation equal VIII: hearing grossly normal bilaterally  IX,X: midline uvula rise  XI: bilateral shoulder shrug equal and strong XII: midline tongue extension  Motor:  5/5 in upper and lower extremities bilaterally including strong and equal grip strength and dorsiflexion/plantar flexion Sensory: Pinprick and light touch normal in all extremities.  Deep Tendon Reflexes: 2+ and symmetric  Cerebellar: normal finger-to-nose with bilateral upper extremities Gait: normal gait and balance CV: distal pulses palpable throughout   Skin: Skin is warm and dry. No rash noted. Pt is not diaphoretic.  Psychiatric: Pt has a normal mood and affect. Behavior is normal. Judgment and thought content normal.  Nursing note and vitals reviewed. ED Results / Procedures / Treatments   Labs (all labs ordered are listed, but only abnormal results are displayed) Labs Reviewed  CBC WITH DIFFERENTIAL/PLATELET  BASIC METABOLIC PANEL  VITAMIN [id number]  TSH  I-STAT BETA HCG BLOOD, ED ([facility], [facility], [facility] ONLY)    EKG None  Radiology DG Lumbar Spine Complete  Result Date: 10/18/2019 CLINICAL DATA:  Multiple falls, lower extremity weakness EXAM: LUMBAR SPINE - COMPLETE 4+ VIEW COMPARISON:  None. FINDINGS: There is no evidence of lumbar spine fracture. Alignment  is normal. Intervertebral disc spaces are maintained. IMPRESSION: No fracture or dislocation of the lumbar spine. Disc spaces and vertebral body heights are preserved. Lumbar disc and neural foraminal pathology may be further evaluated by MRI if indicated by localizing signs and symptoms. Electronically Signed   By: Eddie Candle M.D.   On: 10/18/2019 13:30   CT Head Wo Contrast  Result Date: 10/18/2019 CLINICAL DATA:  Head trauma, headache. Additional history provided: Patient reports right foot pain for over a month pain radiating up right leg, patient reports over 20 falls in the last 4 weeks, pain in both legs (right greater than left) and also  numbness in bilateral legs and feet. EXAM: CT HEAD WITHOUT CONTRAST TECHNIQUE: Contiguous axial images were obtained from the base of the skull through the vertex without intravenous contrast. COMPARISON:  Brain MRI 07/23/2005 FINDINGS: Brain: There is no evidence of acute intracranial hemorrhage, intracranial mass, midline shift or extra-axial fluid collection.No demarcated cortical infarction. Cerebral volume is normal. Vascular: No hyperdense vessel. Skull: Normal. Negative for fracture or focal lesion. Sinuses/Orbits: The orbits are largely excluded from the field of view. Frothy secretions within the imaged right sphenoid sinus. No significant mastoid effusion. Other: A small right forehead hematoma is questioned. IMPRESSION: No evidence of acute intracranial abnormality. A small right forehead hematoma is questioned. Frothy secretions within the imaged right sphenoid sinus. Correlate for acute sinusitis. Electronically Signed   By: Kellie Simmering DO   On: 10/18/2019 13:24    Procedures Procedures (including critical care time)  Medications Ordered in ED Medications - No data to display  ED Course  I have reviewed the triage vital signs and the nursing notes.  Pertinent labs & imaging results that were available during my care of the patient were reviewed by me and considered in my medical decision making (see chart for details).  39 year old female presents for evaluation multiple complaints.  She is afebrile, nonseptic, not ill-appearing.  Patient states she has had intermittent paresthesias which have been occurring for increase x months. She has had frequent falls.  No weakness.  No unilateral symptoms.  She did fall 2 days ago and has had subsequent headache. Not anticoagulated. No midline cervical pain. She does have some mild midline lumbar pain however no red flags for back pain. Low suspicion for cauda equina, discitis, osteomyelitis, transverse myelitis. No ascending weakness to suggest  Guillain-Barr or descending weakness to suggest tetanus. No prior history of diabetes. She does have chronic alcohol use. Question neuropathy from alcohol use. Will get basic labs, head CT, plain film back. Low suspicion for CVA, bacterial infectious process, MS. Per chart review has had similar symptoms in past and tx with Gabapentin with relief.  Labs and imaging personally reviewed and interpreted; CT head negative for acute findings, possible sinusitis however patient denies sinusitis sx. Plain fiom lumbar without acute findings Labs without significant abnormalities, normal Hgb, no leukocytosis, TSH, B12 WNL. Preg negative  Patient symptoms seem to be chronic in nature.  I have low suspicion for acute neurologic, vascular emergency.  We will start her on gabapentin and her follow-up with PCP and neurology.  Do not feel patient needs emergent MRI or inpatient work-up at this time given her symptoms have been occurring for extended duration of time.  Discussed return precautions.  Patient voiced understanding agreeable follow-up.  The patient has been appropriately medically screened and/or stabilized in the ED. I have low suspicion for any other emergent medical condition which would require further screening,  evaluation or treatment in the ED or require inpatient management.  Patient is hemodynamically stable and in no acute distress.  Patient able to ambulate in department prior to ED.  Evaluation does not show acute pathology that would require ongoing or additional emergent interventions while in the emergency department or further inpatient treatment.  I have discussed the diagnosis with the patient and answered all questions.  Pain is been managed while in the emergency department and patient has no further complaints prior to discharge.  Patient is comfortable with plan discussed in room and is stable for discharge at this time.  I have discussed strict return precautions for returning to the  emergency department.  Patient was encouraged to follow-up with PCP/specialist refer to at discharge.    MDM Rules/Calculators/A&P                       Final Clinical Impression(s) / ED Diagnoses Final diagnoses:  Neuropathy    Rx / DC Orders ED Discharge Orders         Ordered    gabapentin (NEURONTIN) 100 MG capsule  3 times daily     10/18/19 1403           Mansi Tokar A, PA-C 10/18/19 1406    Wyvonnia Dusky, MD 10/18/19 1904

## 2019-10-18 NOTE — ED Triage Notes (Signed)
Per pt, states right foot pain for over a month-states pain has radiated up right leg-patient has not seen PCP for complaints

## 2019-10-18 NOTE — Discharge Instructions (Signed)
Follow up with PCP and Neurology.

## 2019-10-23 MED FILL — GABAPENTIN 100 MG CAPSULE: 100 | 30 days supply | Qty: 90 | Fill #0

## 2019-11-05 MED FILL — HYDROCHLOROTHIAZIDE 12.5 MG: 12.5 | 30 days supply | Qty: 30 | Fill #1

## 2020-01-15 MED FILL — HYDROCHLOROTHIAZIDE 12.5 MG: 12.5 | 30 days supply | Qty: 30 | Fill #0

## 2020-02-19 MED FILL — HYDROCHLOROTHIAZIDE 12.5 MG: 12.5 | 30 days supply | Qty: 30 | Fill #0

## 2020-04-30 ENCOUNTER — Other Ambulatory Visit (HOSPITAL_COMMUNITY): Payer: Self-pay | Admitting: Pain Medicine

## 2020-04-30 MED FILL — GABAPENTIN 300 MG CAPSULE: 300 | 30 days supply | Qty: 90 | Fill #0

## 2020-06-24 MED FILL — IBUPROFEN 800 MG TABS: 800 | 7 days supply | Qty: 20 | Fill #0

## 2020-07-06 MED FILL — GABAPENTIN 300 MG CAPSULE: 300 | 30 days supply | Qty: 90 | Fill #1

## 2020-07-26 ENCOUNTER — Ambulatory Visit (INDEPENDENT_AMBULATORY_CARE_PROVIDER_SITE_OTHER): Payer: Commercial Managed Care - PPO

## 2020-07-26 ENCOUNTER — Ambulatory Visit (HOSPITAL_COMMUNITY)
Admission: EM | Admit: 2020-07-26 | Discharge: 2020-07-26 | Disposition: A | Discharge status: Home / Self Care | Payer: Commercial Managed Care - PPO | Attending: Emergency Medicine | Admitting: Emergency Medicine

## 2020-07-26 ENCOUNTER — Other Ambulatory Visit: Payer: Self-pay

## 2020-07-26 ENCOUNTER — Encounter (HOSPITAL_COMMUNITY): Payer: Self-pay

## 2020-07-26 DIAGNOSIS — M26621 Arthralgia of right temporomandibular joint: Secondary | ICD-10-CM

## 2020-07-26 DIAGNOSIS — S39012A Strain of muscle, fascia and tendon of lower back, initial encounter: Secondary | ICD-10-CM

## 2020-07-26 DIAGNOSIS — S20229A Contusion of unspecified back wall of thorax, initial encounter: Secondary | ICD-10-CM

## 2020-07-26 DIAGNOSIS — M5459 Other low back pain: Secondary | ICD-10-CM

## 2020-07-26 MED ORDER — IBUPROFEN 600 MG PO TABS: 600.0000 mg | ORAL_TABLET | Freq: Four times a day (QID) | ORAL | 0 refills | Status: DC | PRN

## 2020-07-26 MED ORDER — CYCLOBENZAPRINE HCL 10 MG PO TABS: 10.0000 mg | ORAL_TABLET | Freq: Every day | ORAL | 0 refills | Status: DC

## 2020-07-26 MED ORDER — TIZANIDINE HCL 4 MG PO TABS: 4.0000 mg | ORAL_TABLET | Freq: Three times a day (TID) | ORAL | 0 refills | Status: DC | PRN

## 2020-07-26 NOTE — ED Provider Notes (Signed)
HPI  SUBJECTIVE:  Alyssa Sullivan is a 39 y.o. female who presents with 2 days of intermittent, throbbing bilateral lower midline back pain after being pulled down 4-6 steps by her dog.  She states that she landed directly on her buttocks, bent forward and torqued to the left.  She states pain last 15 to 20 minutes.  She states this is similar to the previous time she "threw her back out" but that it is not as bad as it was previously.  She states that she is able to walk.  She denies any urinary or fecal incontinence, urinary retention, saddle anesthesia, change in her baseline sciatic pain, fevers, pain worse at night.  She has tried Lederman's ointment, ice, heat, rest, ibuprofen 800 mg as needed.  Lederman's ointment helps.  Symptoms are worse with bending forward, turning to the right,  sitting for a long Time. No  Antipyretic in the past 6 hours.  She has a past medical history of hypertension, chronic hip pain, low back pain with sciatica, bilateral lower extremity neuropathy.  No history of osteoporosis, prolonged steroid use, cancer, multiple myeloma.    She also reports right ear pain for the past 3 days starting after the fall.  States that she hit the left side of her head and she has some soreness there.  She denies loss of consciousness, nausea, vomiting, headache.  No change in hearing, otorrhea, current URI or allergy symptoms.  She states that her pain is worse when she puts her finger in her ear with sleeping on the right side of her head, no alleviating factors.  She has not tried anything for this.  She states that she does not grind her teeth at night.   PMD: Dr. Valora Piccolo at [facility].  Past Medical History:  Diagnosis Date  . Asthma   . Heel spur   . Hypertension   . Plantar fasciitis     Past Surgical History:  Procedure Laterality Date  . MOUTH SURGERY      History reviewed. No pertinent family history.  Social History   Tobacco Use  . Smoking status: Current  Every Day Smoker    Types: Cigarettes  . Smokeless tobacco: Never Used  Substance Use Topics  . Alcohol use: Yes    Comment:  wine daily  . Drug use: No    No current facility-administered medications for this encounter.  Current Outpatient Medications:  .  amLODipine (NORVASC) 5 MG tablet, Take 1 tablet (5 mg total) by mouth daily. (Patient not taking: Reported on 10/15/2018), Disp: 30 tablet, Rfl: 0 .  cyclobenzaprine (FLEXERIL) 10 MG tablet, Take 1 tablet (10 mg total) by mouth at bedtime., Disp: 20 tablet, Rfl: 0 .  gabapentin (NEURONTIN) 100 MG capsule, Take 1 capsule (100 mg total) by mouth 3 (three) times daily., Disp: 90 capsule, Rfl: 0 .  HYDROcodone-homatropine (HYCODAN) 5-1.5 MG/5ML syrup, Take 5 mLs by mouth every 6 (six) hours as needed for cough., Disp: 90 mL, Rfl: 0 .  ibuprofen (ADVIL) 600 MG tablet, Take 1 tablet (600 mg total) by mouth every 6 (six) hours as needed., Disp: 30 tablet, Rfl: 0 .  tiZANidine (ZANAFLEX) 4 MG tablet, Take 1 tablet (4 mg total) by mouth every 8 (eight) hours as needed for muscle spasms., Disp: 30 tablet, Rfl: 0  Allergies  Allergen Reactions  . Codeine Hives  . Cranberry     Throat closes up  . Fig Extract [Ficus] Hives  . Penicillins Hives    +  makes edges of hair fall out.      ROS  As noted in HPI.   Physical Exam  BP 114/68 (BP Location: Left Arm)   Pulse 88   Temp 98.5 F (36.9 C) (Oral)   Resp 17   SpO2 100%   Constitutional: Well developed, well nourished, no acute distress Eyes:  EOMI, conjunctiva normal bilaterally HENT: Normocephalic, atraumatic,mucus membranes moist.  Right external ear, external ear canals normal.  Pain with patient of tragus right side.  No pain with traction on pinna.  Right TM normal.  Right TMJ tender, no crepitus.  Respiratory: Normal inspiratory effort Cardiovascular: Normal rate GI: nondistended. No suprapubic tenderness skin: No rash, skin intact Musculoskeletal: no CVAT. + paralumbar  tenderness, + muscle spasm. + bony tenderness L4- L5-S1.  No bruising. Bilateral lower extremities nontender, baseline ROM with intact DP pulses. No pain with int/ext rotation flex/extension hips bilaterally. SLR neg bilaterally. Sensation baseline light touch bilaterally for Pt, DTR's symmetric and intact bilaterally KJ, Motor symmetric bilateral 5/5 hip flexion, quadriceps, hamstrings, EHL, foot dorsiflexion, foot plantarflexion, gait normal Neurologic: Alert & oriented x 3, no focal neuro deficits Psychiatric: Speech and behavior appropriate   ED Course   Medications - No data to display  Orders Placed This Encounter  Procedures  . DG Lumbar Spine Complete    Standing Status:   Standing    Number of Occurrences:   1    Order Specific Question:   Reason for Exam (SYMPTOM  OR DIAGNOSIS REQUIRED)    Answer:   fall bonyt tenderness L4-S1 r/o fx    Order Specific Question:   Is patient pregnant?    Answer:   No    No results found for this or any previous visit (from the past 24 hour(s)). DG Lumbar Spine Complete  Result Date: 07/26/2020 CLINICAL DATA:  Fall 2 days ago with low back pain/tenderness. EXAM: LUMBAR SPINE - COMPLETE 4+ VIEW COMPARISON:  10/18/2019 FINDINGS: Vertebral body alignment, heights and disc space heights are normal. There is no compression fracture or subluxation. Remainder of the exam is unchanged. IMPRESSION: No acute findings. Electronically Signed   By: Daniel  Boyle M.D.   On: 07/26/2020 15:10    ED Clinical Impression  1. Contusion of back, unspecified laterality, initial encounter   2. Strain of lumbar region, initial encounter   3. Arthralgia of right temporomandibular joint     ED Assessment/Plan   1.  Back pain-  patient had direct trauma to the back and bony tenderness so imaged L-spine.  Reviewed  imaging independently.  Normal L-spine peer see radiology report for full details.  Patient with a contusion of the low back, lumbar strain.  Will  send home with Zanaflex, Tylenol/ibuprofen.   2.  Right otalgia.  Presentation consistent with right TMJ arthralgia.  Will advise soft diet for the next few days, Tylenol/ibuprofen, Zanaflex.  Will send home with a wait-and-see prescription of Flexeril for her to take at night to help prevent her from grinding her teeth if the Zanaflex does not help.    Discussed imaging, medical decision-making, and plan for follow-up with the patient.  Discussed signs and symptoms that should prompt return to the emergency department.  Patient agrees with plan.   Meds ordered this encounter  Medications  . ibuprofen (ADVIL) 600 MG tablet    Sig: Take 1 tablet (600 mg total) by mouth every 6 (six) hours as needed.    Dispense:  30 tablet      Refill:  0  . tiZANidine (ZANAFLEX) 4 MG tablet    Sig: Take 1 tablet (4 mg total) by mouth every 8 (eight) hours as needed for muscle spasms.    Dispense:  30 tablet    Refill:  0  . cyclobenzaprine (FLEXERIL) 10 MG tablet    Sig: Take 1 tablet (10 mg total) by mouth at bedtime.    Dispense:  20 tablet    Refill:  0    *This clinic note was created using [employer]. Therefore, there may be occasional mistakes despite careful proofreading.  ?     Melynda Ripple, MD 07/26/20 [phone]

## 2020-07-26 NOTE — Discharge Instructions (Addendum)
Your x-rays were normal.  Take 600 mg of ibuprofen combined with 1000 mg of Tylenol together 3-4 times a day as needed for pain.  Flexeril will help with the muscle spasms.  Soft diet for the next several days.  The Tylenol ibuprofen will help with the pain and inflammation in your jaw joint.  The Zanaflex and Flexeril muscle relaxants.  The Zanaflex might help prevent you from grinding your teeth at night.  If it does not help after several days of rest, Tylenol/ibuprofen, you can start Flexeril at night.

## 2020-07-26 NOTE — ED Triage Notes (Signed)
Pt presents with generalized back pain after a fall down some steps on christmas eve while walking her dog.

## 2020-07-27 MED FILL — tiZANidine HCL 4 MG TABS: 4 | 10 days supply | Qty: 30 | Fill #0

## 2020-07-27 MED FILL — IBUPROFEN 600 MG TABLET: 600 | 8 days supply | Qty: 30 | Fill #0

## 2020-07-27 MED FILL — CYCLOBENZAPRINE HCL 10 MG T: 10 | 20 days supply | Qty: 20 | Fill #0

## 2020-08-14 MED FILL — GABAPENTIN 300 MG CAPSULE: 300 | 30 days supply | Qty: 90 | Fill #2

## 2020-12-09 ENCOUNTER — Emergency Department (HOSPITAL_COMMUNITY): Payer: Commercial Managed Care - PPO

## 2020-12-09 ENCOUNTER — Ambulatory Visit (HOSPITAL_COMMUNITY)
Admission: EM | Admit: 2020-12-09 | Discharge: 2020-12-09 | Disposition: A | Discharge status: Home / Self Care | Payer: Commercial Managed Care - PPO | Attending: Family Medicine | Admitting: Family Medicine

## 2020-12-09 ENCOUNTER — Emergency Department (HOSPITAL_COMMUNITY)
Admission: EM | Admit: 2020-12-09 | Discharge: 2020-12-09 | Disposition: A | Discharge status: Home / Self Care | Payer: Commercial Managed Care - PPO | Attending: Emergency Medicine | Admitting: Emergency Medicine

## 2020-12-09 ENCOUNTER — Encounter (HOSPITAL_COMMUNITY): Payer: Self-pay

## 2020-12-09 ENCOUNTER — Emergency Department (HOSPITAL_BASED_OUTPATIENT_CLINIC_OR_DEPARTMENT_OTHER): Payer: Commercial Managed Care - PPO

## 2020-12-09 ENCOUNTER — Other Ambulatory Visit: Payer: Self-pay

## 2020-12-09 DIAGNOSIS — K5900 Constipation, unspecified: Secondary | ICD-10-CM

## 2020-12-09 DIAGNOSIS — I10 Essential (primary) hypertension: Secondary | ICD-10-CM | POA: Insufficient documentation

## 2020-12-09 DIAGNOSIS — J45909 Unspecified asthma, uncomplicated: Secondary | ICD-10-CM | POA: Insufficient documentation

## 2020-12-09 DIAGNOSIS — R110 Nausea: Secondary | ICD-10-CM | POA: Insufficient documentation

## 2020-12-09 DIAGNOSIS — R252 Cramp and spasm: Secondary | ICD-10-CM | POA: Insufficient documentation

## 2020-12-09 DIAGNOSIS — D259 Leiomyoma of uterus, unspecified: Secondary | ICD-10-CM

## 2020-12-09 DIAGNOSIS — F17210 Nicotine dependence, cigarettes, uncomplicated: Secondary | ICD-10-CM | POA: Insufficient documentation

## 2020-12-09 DIAGNOSIS — R1084 Generalized abdominal pain: Secondary | ICD-10-CM | POA: Insufficient documentation

## 2020-12-09 DIAGNOSIS — R112 Nausea with vomiting, unspecified: Secondary | ICD-10-CM

## 2020-12-09 DIAGNOSIS — Z3202 Encounter for pregnancy test, result negative: Secondary | ICD-10-CM

## 2020-12-09 HISTORY — DX: Benign neoplasm of connective and other soft tissue, unspecified: D21.9

## 2020-12-09 LAB — POCT URINALYSIS DIPSTICK, ED / UC
Bilirubin Urine: NEGATIVE
Glucose, UA: NEGATIVE mg/dL
Hgb urine dipstick: NEGATIVE
Ketones, ur: NEGATIVE mg/dL
Leukocytes,Ua: NEGATIVE
Nitrite: NEGATIVE
Protein, ur: NEGATIVE mg/dL
Specific Gravity, Urine: >=1.03 (ref 1.005–1.030)
Urobilinogen, UA: 0.2 mg/dL (ref 0.0–1.0)
pH: 6 (ref 5.0–8.0)

## 2020-12-09 LAB — COMPREHENSIVE METABOLIC PANEL
ALT: 14 U/L (ref 0–44)
AST: 17 U/L (ref 15–41)
Albumin: 3.2 g/dL — ABNORMAL LOW (ref 3.5–5.0)
Alkaline Phosphatase: 29 U/L — ABNORMAL LOW (ref 38–126)
Anion gap: 3 — ABNORMAL LOW (ref 5–15)
BUN: 7 mg/dL (ref 6–20)
CO2: 26 mmol/L (ref 22–32)
Calcium: 9 mg/dL (ref 8.9–10.3)
Chloride: 108 mmol/L (ref 98–111)
Creatinine, Ser: 0.83 mg/dL (ref 0.44–1.00)
GFR, Estimated: >60 mL/min (ref >60)
Glucose, Bld: 100 mg/dL — ABNORMAL HIGH (ref 70–99)
Potassium: 4.4 mmol/L (ref 3.5–5.1)
Sodium: 137 mmol/L (ref 135–145)
Total Bilirubin: 0.5 mg/dL (ref 0.3–1.2)
Total Protein: 6.6 g/dL (ref 6.5–8.1)

## 2020-12-09 LAB — CBC
HCT: 40.2 % (ref 36.0–46.0)
Hemoglobin: 12.8 g/dL (ref 12.0–15.0)
MCH: 30 pg (ref 26.0–34.0)
MCHC: 31.8 g/dL (ref 30.0–36.0)
MCV: 94.1 fL (ref 80.0–100.0)
Platelets: 290 10*3/uL (ref 150–400)
RBC: 4.27 MIL/uL (ref 3.87–5.11)
RDW: 14.1 % (ref 11.5–15.5)
WBC: 3.7 10*3/uL — ABNORMAL LOW (ref 4.0–10.5)
nRBC: 0 % (ref 0.0–0.2)

## 2020-12-09 LAB — URINALYSIS, ROUTINE W REFLEX MICROSCOPIC
Bilirubin Urine: NEGATIVE
Glucose, UA: NEGATIVE mg/dL
Hgb urine dipstick: NEGATIVE
Ketones, ur: NEGATIVE mg/dL
Leukocytes,Ua: NEGATIVE
Nitrite: NEGATIVE
Protein, ur: NEGATIVE mg/dL
Specific Gravity, Urine: 1.024 (ref 1.005–1.030)
pH: 6 (ref 5.0–8.0)

## 2020-12-09 LAB — LIPASE, BLOOD: Lipase: 37 U/L (ref 11–51)

## 2020-12-09 LAB — POC URINE PREG, ED: Preg Test, Ur: NEGATIVE

## 2020-12-09 MED ORDER — IOHEXOL 300 MG/ML  SOLN
75.0000 mL | Freq: Once | INTRAMUSCULAR | Status: AC | PRN
  Administered 2020-12-09: 75 mL via INTRAVENOUS

## 2020-12-09 NOTE — ED Triage Notes (Signed)
Pt in with c/o lower abdominal pain that has been going on for 7 months and vomiting that started on Monday  Pt has not had medication for sxs

## 2020-12-09 NOTE — ED Provider Notes (Signed)
[facility] EMERGENCY DEPARTMENT Provider Note   CSN: [id number] Arrival date & time: 12/09/20  1121     History Chief Complaint  Patient presents with  . Abdominal Pain    Alyssa Sullivan is a 40 y.o. female.  Alyssa Sullivan was told in the past that she had uterine fibroids.  These were found incidentally when she had imaging for a back injury.  She has had diffuse abdominal pain for about 7 months.  Her abdomen is swollen, and despite having somewhat unintentional weight loss of at least 75 pounds, she has had an increasing abdominal girth.  She is also has some perineal pressure and has episodes of twitching and spasming in her left leg.  She was seen at urgent care today and was sent here for further evaluation.  The history is provided by the patient.  Abdominal Pain Pain location:  Generalized Pain quality: sharp   Pain radiates to:  Does not radiate Pain severity:  Moderate Onset quality:  Gradual Duration: 7 months. Timing:  Constant Progression:  Worsening Chronicity:  New Context: not previous surgeries, not recent illness and not trauma   Relieved by:  Nothing Exacerbated by: lying on her stomach. Ineffective treatments:  OTC medications Associated symptoms: constipation and nausea   Associated symptoms: no anorexia, no chest pain, no chills, no cough, no diarrhea, no dysuria, no hematemesis, no hematochezia, no hematuria, no shortness of breath, no sore throat, no vaginal bleeding, no vaginal discharge and no vomiting  Fever: night sweats.        Past Medical History:  Diagnosis Date  . Asthma   . Fibroids   . Heel spur   . Hypertension   . Plantar fasciitis     Patient Active Problem List   Diagnosis Date Noted  . Chronic hip pain 02/21/2013  . Low back pain with sciatica 02/21/2013    Past Surgical History:  Procedure Laterality Date  . MOUTH SURGERY       OB History   No obstetric history on file.     History  reviewed. No pertinent family history.  Social History   Tobacco Use  . Smoking status: Current Every Day Smoker    Types: Cigarettes  . Smokeless tobacco: Never Used  Substance Use Topics  . Alcohol use: Yes    Comment:  wine daily  . Drug use: No    Home Medications Prior to Admission medications   Medication Sig Start Date End Date Taking? Authorizing Provider  amLODipine (NORVASC) 5 MG tablet Take 1 tablet (5 mg total) by mouth daily. Patient not taking: Reported on 10/15/2018 06/26/18   Rosemarie Ax, MD  cyclobenzaprine (FLEXERIL) 10 MG tablet Take 1 tablet (10 mg total) by mouth at bedtime. 07/26/20   Melynda Ripple, MD  gabapentin (NEURONTIN) 100 MG capsule Take 1 capsule (100 mg total) by mouth 3 (three) times daily. 10/18/19 11/17/19  Henderly, Britni A, PA-C  gabapentin (NEURONTIN) 300 MG capsule TAKE 1 CAPSULE BY MOUTH THREE TIMES DAILY. 04/30/20 04/30/21  Celedonio Miyamoto, MD  HYDROcodone-homatropine [facility]) 5-1.5 MG/5ML syrup Take 5 mLs by mouth every 6 (six) hours as needed for cough. 08/09/17   Vanessa Kick, MD  ibuprofen (ADVIL) 600 MG tablet Take 1 tablet (600 mg total) by mouth every 6 (six) hours as needed. 07/26/20   Melynda Ripple, MD  tiZANidine (ZANAFLEX) 4 MG tablet Take 1 tablet (4 mg total) by mouth every 8 (eight) hours as needed for muscle spasms. 07/26/20  Melynda Ripple, MD    Allergies    Codeine, Cranberry, Fig extract [ficus], and Penicillins  Review of Systems   Review of Systems  Constitutional: Positive for unexpected weight change. Negative for chills. Fever: night sweats.  HENT: Negative for ear pain and sore throat.   Eyes: Negative for pain and visual disturbance.  Respiratory: Negative for cough and shortness of breath.   Cardiovascular: Negative for chest pain and palpitations.  Gastrointestinal: Positive for abdominal pain, constipation and nausea. Negative for anorexia, diarrhea, hematemesis, hematochezia and vomiting.   Genitourinary: Negative for dysuria, hematuria, vaginal bleeding and vaginal discharge.  Musculoskeletal: Negative for arthralgias and back pain.  Skin: Negative for color change and rash.  Neurological: Negative for seizures and syncope.  All other systems reviewed and are negative.   Physical Exam Updated Vital Signs BP (!) 121/92   Pulse 84   Temp 98.6 F (37 C) (Oral)   Resp 18   SpO2 99%   Physical Exam Vitals and nursing note reviewed.  HENT:     Head: Normocephalic and atraumatic.  Eyes:     General: No scleral icterus. Pulmonary:     Effort: Pulmonary effort is normal. No respiratory distress.  Abdominal:     General: Abdomen is protuberant. Bowel sounds are decreased.     Palpations: There is mass.     Tenderness: There is generalized abdominal tenderness.     Comments: Mass at central and left abdomen. Unclear whether this represents an enlarged uterus or an abdominal mass.  Musculoskeletal:     Cervical back: Normal range of motion.  Skin:    General: Skin is warm and dry.     Capillary Refill: Capillary refill takes less than 2 seconds.  Neurological:     General: No focal deficit present.     Mental Status: She is alert.  Psychiatric:        Mood and Affect: Mood normal.     ED Results / Procedures / Treatments   Labs (all labs ordered are listed, but only abnormal results are displayed) Labs Reviewed  COMPREHENSIVE METABOLIC PANEL - Abnormal; Notable for the following components:      Result Value   Glucose, Bld 100 (*)    Albumin 3.2 (*)    Alkaline Phosphatase 29 (*)    Anion gap 3 (*)    All other components within normal limits  CBC - Abnormal; Notable for the following components:   WBC 3.7 (*)    All other components within normal limits  URINALYSIS, ROUTINE W REFLEX MICROSCOPIC - Abnormal; Notable for the following components:   APPearance HAZY (*)    All other components within normal limits  LIPASE, BLOOD     EKG None  Radiology No results found.  Procedures Procedures   Medications Ordered in ED Medications - No data to display  ED Course  I have reviewed the triage vital signs and the nursing notes.  Pertinent labs & imaging results that were available during my care of the patient were reviewed by me and considered in my medical decision making (see chart for details).    MDM Rules/Calculators/A&P                          [name] presented with abdominal pain.  She appears to have a mass in her abdomen, and this might be secondary to fibroids or another etiology.  She is currently awaiting CT scan.  Labs are  overall reassuring.  Symptoms have been somewhat chronic in nature and do not suggest acute intestinal rupture, obstruction.  Final Clinical Impression(s) / ED Diagnoses Final diagnoses:  Generalized abdominal pain    Rx / DC Orders ED Discharge Orders    None       Arnaldo Natal, MD 12/09/20 1624

## 2020-12-09 NOTE — ED Notes (Signed)
Patient is being discharged from the Urgent Care and sent to the Emergency Department via pov . Per Merrie Roof, PA , patient is in need of higher level of care due to abdominal pain with emesis. Patient is aware and verbalizes understanding of plan of care.  Vitals:   12/09/20 1011  BP: 123/83  Pulse: 77  Resp: 19  Temp: 98.4 F (36.9 C)  SpO2: 97%

## 2020-12-09 NOTE — ED Provider Notes (Signed)
This patient was seen and examined, she is well-appearing, she has had a significant weight loss but has not seen any decrease in the size of her abdomen which raised concern, her abdominal girth is essentially nontender.  She is well-appearing with normal vital signs and the CT scan shows a very large fibroid uterus.  I discussed the care with Dr. Roselie Awkward who recommends the patient [facility] [place].  The patient is agreeable, stable for discharge   Noemi Chapel, MD 12/09/20 1031

## 2020-12-09 NOTE — ED Provider Notes (Signed)
[facility]    CSN: [id number] Arrival date & time: 12/09/20  5462      History   Chief Complaint Chief Complaint  Patient presents with  . Abdominal Pain  . Emesis    HPI Alyssa Sullivan is a 40 y.o. female.   Presenting today with about 44-month history of ongoing diffuse lower abdominal pain, intermittent episodes of vomiting, now the past week or so having some new onset constipation associated.  She states she feels significant fullness in her abdominal area, particularly lower.  Was seen in the ED 07/2020 for similar issues and CT abdomen pelvis showing large uterine fibroids.  She has had no follow-up since this imaging and symptoms have worsened.  She does note significant weight fluctuation over the last 6 months as well as poor appetite, fatigue that she associates with the pain.  Denies abnormal vaginal bleeding, fevers, chills, chest pain, shortness of breath.  Has been taking over-the-counter pain relievers, Flexeril with no benefit to current symptoms.     Past Medical History:  Diagnosis Date  . Asthma   . Fibroids   . Heel spur   . Hypertension   . Plantar fasciitis     Patient Active Problem List   Diagnosis Date Noted  . Chronic hip pain 02/21/2013  . Low back pain with sciatica 02/21/2013    Past Surgical History:  Procedure Laterality Date  . MOUTH SURGERY      OB History   No obstetric history on file.      Home Medications    Prior to Admission medications   Medication Sig Start Date End Date Taking? Authorizing Provider  amLODipine (NORVASC) 5 MG tablet Take 1 tablet (5 mg total) by mouth daily. Patient not taking: Reported on 10/15/2018 06/26/18   Rosemarie Ax, MD  cyclobenzaprine (FLEXERIL) 10 MG tablet Take 1 tablet (10 mg total) by mouth at bedtime. 07/26/20   Melynda Ripple, MD  gabapentin (NEURONTIN) 100 MG capsule Take 1 capsule (100 mg total) by mouth 3 (three) times daily. 10/18/19 11/17/19  Henderly, Britni  A, PA-C  gabapentin (NEURONTIN) 300 MG capsule TAKE 1 CAPSULE BY MOUTH THREE TIMES DAILY. 04/30/20 04/30/21  Celedonio Miyamoto, MD  HYDROcodone-homatropine [facility]) 5-1.5 MG/5ML syrup Take 5 mLs by mouth every 6 (six) hours as needed for cough. 08/09/17   Vanessa Kick, MD  ibuprofen (ADVIL) 600 MG tablet Take 1 tablet (600 mg total) by mouth every 6 (six) hours as needed. 07/26/20   Melynda Ripple, MD  tiZANidine (ZANAFLEX) 4 MG tablet Take 1 tablet (4 mg total) by mouth every 8 (eight) hours as needed for muscle spasms. 07/26/20   Melynda Ripple, MD    Family History History reviewed. No pertinent family history.  Social History Social History   Tobacco Use  . Smoking status: Current Every Day Smoker    Types: Cigarettes  . Smokeless tobacco: Never Used  Substance Use Topics  . Alcohol use: Yes    Comment:  wine daily  . Drug use: No     Allergies   Codeine, Cranberry, Fig extract [ficus], and Penicillins   Review of Systems Review of Systems Per HPI  Physical Exam Triage Vital Signs ED Triage Vitals  Enc Vitals Group     BP 12/09/20 1011 123/83     Pulse Rate 12/09/20 1011 77     Resp 12/09/20 1011 19     Temp 12/09/20 1011 98.4 F (36.9 C)     Temp src --  SpO2 12/09/20 1011 97 %     Weight --      Height --      Head Circumference --      Peak Flow --      Pain Score 12/09/20 0952 7     Pain Loc --      Pain Edu? --      Excl. in [place]? --    No data found.  Updated Vital Signs BP 123/83   Pulse 77   Temp 98.4 F (36.9 C)   Resp 19   SpO2 97%   Visual Acuity Right Eye Distance:   Left Eye Distance:   Bilateral Distance:    Right Eye Near:   Left Eye Near:    Bilateral Near:     Physical Exam Vitals and nursing note reviewed.  Constitutional:      Appearance: Normal appearance. She is not ill-appearing.  HENT:     Head: Atraumatic.     Mouth/Throat:     Mouth: Mucous membranes are moist.     Pharynx: Oropharynx is clear. No posterior  oropharyngeal erythema.  Eyes:     Extraocular Movements: Extraocular movements intact.     Conjunctiva/sclera: Conjunctivae normal.  Cardiovascular:     Rate and Rhythm: Normal rate and regular rhythm.     Heart sounds: Normal heart sounds.  Pulmonary:     Effort: Pulmonary effort is normal.     Breath sounds: Normal breath sounds.  Abdominal:     General: Bowel sounds are normal.     Palpations: There is mass.     Tenderness: There is abdominal tenderness. There is guarding. There is no right CVA tenderness or left CVA tenderness.     Comments: Palpable distention of uterus, multiple palpable firm masses up to near umbilicus.  Significant tender to palpation in multiple areas of the abdomen diffusely.  Musculoskeletal:        General: Normal range of motion.     Cervical back: Normal range of motion and neck supple.  Skin:    General: Skin is warm and dry.  Neurological:     Mental Status: She is alert and oriented to person, place, and time.  Psychiatric:        Mood and Affect: Mood normal.        Thought Content: Thought content normal.        Judgment: Judgment normal.    UC Treatments / Results  Labs (all labs ordered are listed, but only abnormal results are displayed) Labs Reviewed  POCT URINALYSIS DIPSTICK, ED / UC  POC URINE PREG, ED    EKG   Radiology VAS [place] LOWER EXTREMITY VENOUS (DVT) (ONLY [facility] & [facility])  Result Date: 12/09/2020  Lower Venous DVT Study Patient Name:  Lenette Terlizzi  Date of Exam:   12/09/2020 Medical Rec #: [id number]             Accession #:    [id number] Date of Birth: 30-Jan-1981             Patient Gender: F Patient Age:   68Y Exam Location:  [facility] Procedure:      VAS [place] LOWER EXTREMITY VENOUS (DVT) Referring Phys: Arnaldo Natal --------------------------------------------------------------------------------  Indications: Leg spasms.  Comparison Study: No prior study on file Performing Technologist: Sharion Dove RVS   Examination Guidelines: A complete evaluation includes B-mode imaging, spectral Doppler, color Doppler, and power Doppler as needed of all accessible portions of each vessel. Bilateral  testing is considered an integral part of a complete examination. Limited examinations for reoccurring indications may be performed as noted. The reflux portion of the exam is performed with the patient in reverse Trendelenburg.  +-----+---------------+---------+-----------+----------+--------------+ RIGHTCompressibilityPhasicitySpontaneityPropertiesThrombus Aging +-----+---------------+---------+-----------+----------+--------------+ CFV  Full           Yes      Yes                                 +-----+---------------+---------+-----------+----------+--------------+   +---------+---------------+---------+-----------+----------+-------------------+ LEFT     CompressibilityPhasicitySpontaneityPropertiesThrombus Aging      +---------+---------------+---------+-----------+----------+-------------------+ CFV      Full           Yes      Yes                                      +---------+---------------+---------+-----------+----------+-------------------+ SFJ      Full                                                             +---------+---------------+---------+-----------+----------+-------------------+ FV Prox  Full                                                             +---------+---------------+---------+-----------+----------+-------------------+ FV Mid   Full                                                             +---------+---------------+---------+-----------+----------+-------------------+ FV DistalFull                                                             +---------+---------------+---------+-----------+----------+-------------------+ PFV      Full                                                              +---------+---------------+---------+-----------+----------+-------------------+ POP      Full           Yes      No                   Rouleaux flow noted +---------+---------------+---------+-----------+----------+-------------------+ PTV      Full                                                             +---------+---------------+---------+-----------+----------+-------------------+  PERO     Full                                                             +---------+---------------+---------+-----------+----------+-------------------+ Gastroc  Full                                                             +---------+---------------+---------+-----------+----------+-------------------+    Summary: RIGHT: - No evidence of common femoral vein obstruction.  LEFT: - There is no evidence of deep vein thrombosis in the lower extremity.  *See table(s) above for measurements and observations.    Preliminary     Procedures Procedures (including critical care time)  Medications Ordered in UC Medications - No data to display  Initial Impression / Assessment and Plan / UC Course  I have reviewed the triage vital signs and the nursing notes.  Pertinent labs & imaging results that were available during my care of the patient were reviewed by me and considered in my medical decision making (see chart for details).     Concern for significant enlargement of uterine masses, which on CT scan 5 months ago appeared benign but she never had follow-up on.  Is now causing her severe abdominal pain, constipation, vomiting with concern for obstruction due to masses.  She is agreeable to going to the ED for immediate reimaging and further evaluation.  Her significant other will drive her there at this time.  She is hemodynamically stable for transport.  Final Clinical Impressions(s) / UC Diagnoses   Final diagnoses:  Uterine leiomyoma, unspecified location  Constipation, unspecified  constipation type  Non-intractable vomiting with nausea, unspecified vomiting type   Discharge Instructions   None    ED Prescriptions    None     PDMP not reviewed this encounter.   Volney American, [place] 12/09/20 1343

## 2020-12-09 NOTE — Progress Notes (Signed)
VASCULAR LAB    Left lower extremity venous duplex has been performed.  See CV proc for preliminary results.  Gave report to Dr. Birdie Sons, [facility], RVT 12/09/2020, 1:35 PM

## 2020-12-09 NOTE — ED Triage Notes (Signed)
Pt reports LQ abd pain. Pt reports it started x7 months ago. Pt reports vomiting. Pt reports she was sent from [place] for [place] due to h/o fibroids.

## 2020-12-09 NOTE — Discharge Instructions (Signed)
Please call the phone number above [phone], they will make an appointment for you to have a surgical consultation for removal of your uterus and fibroids.  Return to the ER for severe or worsening symptoms however you may feel safe taking Tylenol or ibuprofen as needed for pain

## 2021-01-07 ENCOUNTER — Other Ambulatory Visit (HOSPITAL_COMMUNITY)
Admission: RE | Admit: 2021-01-07 | Discharge: 2021-01-07 | Disposition: A | Discharge status: Home / Self Care | Payer: Medicaid Other | Source: Ambulatory Visit | Attending: Obstetrics and Gynecology | Admitting: Obstetrics and Gynecology

## 2021-01-07 ENCOUNTER — Ambulatory Visit (INDEPENDENT_AMBULATORY_CARE_PROVIDER_SITE_OTHER): Payer: No Typology Code available for payment source | Admitting: Obstetrics and Gynecology

## 2021-01-07 ENCOUNTER — Encounter: Payer: Self-pay | Admitting: Obstetrics and Gynecology

## 2021-01-07 ENCOUNTER — Other Ambulatory Visit: Payer: Self-pay

## 2021-01-07 ENCOUNTER — Other Ambulatory Visit (HOSPITAL_COMMUNITY): Payer: Self-pay

## 2021-01-07 VITALS — BP 122/93 | HR 94 | Wt 186.1 lb

## 2021-01-07 DIAGNOSIS — Z1231 Encounter for screening mammogram for malignant neoplasm of breast: Secondary | ICD-10-CM

## 2021-01-07 DIAGNOSIS — Z124 Encounter for screening for malignant neoplasm of cervix: Secondary | ICD-10-CM | POA: Diagnosis present

## 2021-01-07 DIAGNOSIS — D259 Leiomyoma of uterus, unspecified: Secondary | ICD-10-CM

## 2021-01-07 DIAGNOSIS — Z113 Encounter for screening for infections with a predominantly sexual mode of transmission: Secondary | ICD-10-CM | POA: Diagnosis not present

## 2021-01-07 DIAGNOSIS — K5790 Diverticulosis of intestine, part unspecified, without perforation or abscess without bleeding: Secondary | ICD-10-CM | POA: Diagnosis not present

## 2021-01-07 DIAGNOSIS — R109 Unspecified abdominal pain: Secondary | ICD-10-CM

## 2021-01-07 DIAGNOSIS — Z01419 Encounter for gynecological examination (general) (routine) without abnormal findings: Secondary | ICD-10-CM | POA: Diagnosis not present

## 2021-01-07 MED ORDER — IBUPROFEN 800 MG PO TABS
800.0000 mg | ORAL_TABLET | Freq: Three times a day (TID) | ORAL | 1 refills | Status: DC | PRN
  Filled 2021-01-07: qty 60, 20d supply, fill #0
  Filled 2021-02-02: qty 21, 7d supply, fill #0

## 2021-01-07 NOTE — Progress Notes (Signed)
GYNECOLOGY ANNUAL PREVENTATIVE CARE ENCOUNTER NOTE  History:     Bryttany Tortorelli is a 40 y.o. G0. female here for a routine annual gynecologic exam.  Current complaints: back and left leg pain, abdominal heaviness.   Pt notes she usually has vaginal bleeding 3 weeks out of the month.  Pt also notes 40 pound weight loss over the last year.   Gynecologic History Patient's last menstrual period was 12/13/2020 (approximate). Contraception: none Last Pap: per pt she never has had a pap smear Last mammogram: pt never had mammogram previously  Obstetric History OB History  No obstetric history on file.    Past Medical History:  Diagnosis Date   Asthma    Fibroids    Heel spur    Hypertension    Plantar fasciitis     Past Surgical History:  Procedure Laterality Date   MOUTH SURGERY      Current Outpatient Medications on File Prior to Visit  Medication Sig Dispense Refill   cetirizine (ZYRTEC) 10 MG tablet Take 10 mg by mouth daily.     Cyanocobalamin (VITAMIN B-12 PO) Take 1 tablet by mouth daily.     Melatonin 10 MG CAPS Take 10 mg by mouth at bedtime as needed (sleep).     Multiple Vitamin (MULTIVITAMIN ADULT PO) Take 1 tablet by mouth daily.     gabapentin (NEURONTIN) 100 MG capsule Take 1 capsule (100 mg total) by mouth 3 (three) times daily. 90 capsule 0   No current facility-administered medications on file prior to visit.    Allergies  Allergen Reactions   Banana Swelling   Codeine Hives   Cranberry     Throat closes up   Fig Extract [Ficus] Hives   Penicillins Hives    + makes edges of hair fall out.     Social History:  reports that she has been smoking cigarettes. She has never used smokeless tobacco. She reports current alcohol use. She reports that she does not use drugs.  No family history on file.  The following portions of the patient's history were reviewed and updated as appropriate: allergies, current medications, past family history, past  medical history, past social history, past surgical history and problem list.  Review of Systems Pertinent items noted in HPI and remainder of comprehensive ROS otherwise negative.  Physical Exam:  BP (!) 122/93   Pulse 94   Wt 186 lb 1.6 oz (84.4 kg)   LMP 12/13/2020 (Approximate)   BMI 28.30 kg/m  CONSTITUTIONAL: Well-developed, well-nourished female in no acute distress.  HENT:  Normocephalic, atraumatic, External right and left ear normal. Oropharynx is clear and moist EYES: Conjunctivae and EOM are normal.  NECK: Normal range of motion, supple, no masses.  Normal thyroid.  SKIN: Skin is warm and dry. No rash noted. Not diaphoretic. No erythema. No pallor. MUSCULOSKELETAL: Normal range of motion. No tenderness.  No cyanosis, clubbing, or edema.  2+ distal pulses. NEUROLOGIC: Alert and oriented to person, place, and time. Normal reflexes, muscle tone coordination.  PSYCHIATRIC: Normal mood and affect. Normal behavior. Normal judgment and thought content. CARDIOVASCULAR: Normal heart rate noted, regular rhythm RESPIRATORY: Clear to auscultation bilaterally. Effort and breath sounds normal, no problems with respiration noted. BREASTS: Symmetric in size. No masses, tenderness, skin changes, nipple drainage, or lymphadenopathy bilaterally. Performed in the presence of a chaperone. ABDOMEN: Soft, no distention noted.  Mild tenderness noted, no rebound or guarding.  PELVIC: Normal appearing external genitalia and urethral meatus; normal appearing vaginal  mucosa and cervix.  No abnormal discharge noted.  Pap smear obtained. Extremely enlarged uterus with fundus well above the umbilicus.  Uterus mildly tender to touch.  Performed in the presence of a chaperone.   CLINICAL DATA:  Abdominal distension. Lower abdominal pain. Vomiting. History of fibroids   EXAM: CT ABDOMEN AND PELVIS WITH CONTRAST   TECHNIQUE: Multidetector CT imaging of the abdomen and pelvis was performed using the  standard protocol following bolus administration of intravenous contrast.   CONTRAST:  44mL OMNIPAQUE IOHEXOL 300 MG/ML  SOLN   COMPARISON:  None.   FINDINGS: Lower chest: The lung bases are clear. No acute airspace disease or pleural effusion.   Hepatobiliary: No focal liver abnormality is seen. No gallstones, gallbladder wall thickening, or biliary dilatation.   Pancreas: No ductal dilatation or inflammation.   Spleen: Normal in size without focal abnormality.   Adrenals/Urinary Tract: Normal adrenal glands. No hydronephrosis or perinephric edema. Homogeneous renal enhancement with symmetric excretion on delayed phase imaging. No evidence of renal calculi. Subcentimeter low-density lesion in the upper medial right kidney is too small to characterize but likely cyst. Urinary bladder is partially distended without wall thickening.   Stomach/Bowel: Unremarkable stomach. Normal positioning of the ligament of Treitz. Pelvic bowel loops are displaced peripherally due to enlarged uterus. Detailed bowel assessment is limited in the absence of enteric contrast. No evidence of obstruction. Moderate colonic stool burden. Diverticulosis of the descending colon. The appendix is normal.   Vascular/Lymphatic: Normal caliber abdominal aorta. Patent portal vein. No bulky abdominopelvic adenopathy.   Reproductive: The uterus is markedly enlarged extending well above the umbilicus measuring 40.3 x 17.1 x 9.1 cm. Diffusely heterogeneous enhancement with innumerable lesions consistent with fibroids. The ovaries are not discretely visualized.   Other: Possible trace pelvic free fluid. Trace free fluid adjacent to the inferior right lobe of the liver. No free air. Tiny fat containing umbilical hernia   Musculoskeletal: There are no acute or suspicious osseous abnormalities. Incidental note of transitional lumbosacral anatomy.   IMPRESSION: 1. Markedly enlarged fibroid uterus extending well  above the umbilicus measuring 47.4 x 17.1 x 9.1 cm. 2. Mild colonic diverticulosis without diverticulitis.   Assessment and Plan:    1. Encounter for screening mammogram for breast cancer scheduled - MM 3D SCREEN BREAST BILATERAL; Future  2. Screening for cervical cancer Pap taken today - Cytology - PAP( [facility])  3. Screening for STDs (sexually transmitted diseases) Per pt request - Hepatitis B Surface AntiGEN - Hepatitis C Antibody - HIV Antibody (routine testing w rflx) - [facility] - Cervicovaginal ancillary only( [facility])  4. Diverticulosis Discussed with pt from results from the last CT scan - Ambulatory referral to Gastroenterology  5. Uterine leiomyoma, unspecified location Discussed with pt and spouse due to large size there are few options at this time other than UFE and hysterectomy.  Hysterectomy would be the preferable choice.  Pt will need endometrial biopsy, ultrasound for better eval of fibroids and possible lupron pretreatment. - US PELVIC COMPLETE WITH TRANSVAGINAL; Future  6. Abdominal pain, unspecified abdominal location  - ibuprofen (ADVIL) 800 MG tablet; Take 1 tablet (800 mg total) by mouth every 8 (eight) hours as needed.  Dispense: 60 tablet; Refill: 1  7. Screening examination for STD (sexually transmitted disease)   Will follow up results of pap smear and manage accordingly. Mammogram scheduled Routine preventative health maintenance measures emphasized. Please refer to After Visit Summary for other counseling recommendations.     F/u in  3 weeks for ultrasound follow up and endometrial biopsy.  Lynnda Shields, MD, [place] [facility], [place]

## 2021-01-08 ENCOUNTER — Telehealth: Payer: Self-pay

## 2021-01-08 LAB — CERVICOVAGINAL ANCILLARY ONLY
Chlamydia: NEGATIVE
Comment: NEGATIVE
Comment: NEGATIVE
Comment: NORMAL
Neisseria Gonorrhea: NEGATIVE
Trichomonas: NEGATIVE

## 2021-01-08 LAB — RPR: RPR Ser Ql: NONREACTIVE

## 2021-01-08 LAB — CYTOLOGY - PAP
Comment: NEGATIVE
Diagnosis: NEGATIVE
High risk HPV: NEGATIVE

## 2021-01-08 LAB — HEPATITIS B SURFACE ANTIGEN: Hepatitis B Surface Ag: NEGATIVE

## 2021-01-08 LAB — HIV ANTIBODY (ROUTINE TESTING W REFLEX): HIV Screen 4th Generation wRfx: NONREACTIVE

## 2021-01-08 LAB — HEPATITIS C ANTIBODY: Hep C Virus Ab: <0.1 s/co ratio (ref 0.0–0.9)

## 2021-01-08 NOTE — Telephone Encounter (Addendum)
CMA called patient and spoke to patient regarding lab results.  Patient understood. No question and concerns. Patient is aware to call if she have any other questions.   Francisco Capuchin, [place] 01/08/2021

## 2021-01-08 NOTE — Telephone Encounter (Signed)
Patient left voicemail requesting a call to discuss test results.

## 2021-01-09 ENCOUNTER — Ambulatory Visit: Payer: Medicaid Other

## 2021-01-09 ENCOUNTER — Ambulatory Visit
Admission: RE | Admit: 2021-01-09 | Discharge: 2021-01-09 | Disposition: A | Discharge status: Home / Self Care | Payer: Medicaid Other | Source: Ambulatory Visit | Attending: Obstetrics and Gynecology | Admitting: Obstetrics and Gynecology

## 2021-01-09 DIAGNOSIS — Z1231 Encounter for screening mammogram for malignant neoplasm of breast: Secondary | ICD-10-CM

## 2021-01-15 ENCOUNTER — Other Ambulatory Visit (HOSPITAL_COMMUNITY): Payer: Self-pay

## 2021-01-28 ENCOUNTER — Ambulatory Visit: Payer: Medicaid Other | Admitting: Obstetrics and Gynecology

## 2021-02-02 ENCOUNTER — Other Ambulatory Visit (HOSPITAL_COMMUNITY): Payer: Self-pay

## 2021-02-03 ENCOUNTER — Ambulatory Visit (INDEPENDENT_AMBULATORY_CARE_PROVIDER_SITE_OTHER): Payer: No Typology Code available for payment source | Admitting: Obstetrics and Gynecology

## 2021-02-03 ENCOUNTER — Encounter: Payer: Self-pay | Admitting: Obstetrics and Gynecology

## 2021-02-03 ENCOUNTER — Other Ambulatory Visit (HOSPITAL_COMMUNITY): Payer: Self-pay

## 2021-02-03 ENCOUNTER — Other Ambulatory Visit: Payer: Self-pay

## 2021-02-03 ENCOUNTER — Other Ambulatory Visit (HOSPITAL_COMMUNITY)
Admission: RE | Admit: 2021-02-03 | Discharge: 2021-02-03 | Disposition: A | Discharge status: Home / Self Care | Payer: No Typology Code available for payment source | Source: Ambulatory Visit | Attending: Obstetrics and Gynecology | Admitting: Obstetrics and Gynecology

## 2021-02-03 DIAGNOSIS — D259 Leiomyoma of uterus, unspecified: Secondary | ICD-10-CM

## 2021-02-03 DIAGNOSIS — N921 Excessive and frequent menstruation with irregular cycle: Secondary | ICD-10-CM | POA: Diagnosis not present

## 2021-02-03 DIAGNOSIS — N920 Excessive and frequent menstruation with regular cycle: Secondary | ICD-10-CM | POA: Insufficient documentation

## 2021-02-03 LAB — POCT PREGNANCY, URINE: Preg Test, Ur: NEGATIVE

## 2021-02-03 MED ORDER — LUPRON DEPOT (3-MONTH) 11.25 MG IM KIT
11.2500 mg | PACK | INTRAMUSCULAR | 0 refills | Status: DC
  Filled 2021-02-03 – 2021-02-26 (×3): qty 1, 90d supply, fill #0
  Filled 2021-03-01: qty 1, 84d supply, fill #0
  Filled 2021-03-05: qty 1, 90d supply, fill #0

## 2021-02-03 NOTE — Progress Notes (Addendum)
ENDOMETRIAL BIOPSY      Alyssa Sullivan is a 40 y.o. G0 here for endometrial biopsy.  The indications for endometrial biopsy were reviewed.  Risks of the biopsy including cramping, bleeding, infection, uterine perforation, inadequate specimen and need for additional procedures were discussed. The patient states she understands and agrees to undergo procedure today. Consent was signed. Time out was performed.   Indications: menorrhagia, fibroid uterus, preoperative preparation Urine HCG: negative  A bivalve speculum was placed into the vagina and the cervix was easily visualized and was prepped with Betadine x2. A single-toothed tenaculum was placed on the anterior lip of the cervix to stabilize it. The 3 mm pipelle was introduced into the endometrial cavity without difficulty to a depth of 8.2 cm, and a moderate amount of tissue was obtained and sent to pathology. This was repeated for a total of 3 passes. The instruments were removed from the patient's vagina. Minimal bleeding from the cervix at the tenaculum was noted.   The patient tolerated the procedure well. Routine post-procedure instructions were given to the patient.    Will base further management on results of biopsy.  Rx sent to pharmacy for depo lupron 11.25 mg as preoperative prep prior to hysterectomy due to large uterine size.  Pt is aware of side effects of chemical menopause including hot flashes.  Will send chart to scheduler for abdominal hysterectomy late September or early October.  Lynnda Shields, MD

## 2021-02-04 ENCOUNTER — Telehealth: Payer: Self-pay | Admitting: *Deleted

## 2021-02-04 NOTE — Telephone Encounter (Signed)
Call to patient regarding surgery options. Patient states she does not have any specific date needs.  Reviewed anticipated 6 weeks out of work and patient does not have STD. Will be working on these plans. 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 Advised will be out of office till 7-20 and will send office number through My Chart ( patient driving).   Pharmacy has not received Lupron yet so will need 10 -12 weeks after injection.  Encounter closed.

## 2021-02-05 LAB — SURGICAL PATHOLOGY

## 2021-02-16 ENCOUNTER — Telehealth: Payer: Self-pay | Admitting: General Practice

## 2021-02-23 ENCOUNTER — Telehealth: Payer: Self-pay | Admitting: Obstetrics and Gynecology

## 2021-02-23 NOTE — Telephone Encounter (Signed)
Patient calling regarding a Rx that her and Dr.Bass discussed at her last visit, about her getting a medication to shrunk her tumors, she said she don't remember the medication and also he never called it in for her.

## 2021-02-25 ENCOUNTER — Other Ambulatory Visit (HOSPITAL_COMMUNITY): Payer: Self-pay

## 2021-02-25 NOTE — Telephone Encounter (Signed)
Call placed to [facility]. Was informed that prescription was a speciality medicine so that it would have been sent to [facility] outpatient pharmacy to be filled. [facility] pharmacy was unaware if call was ever placed to patient to notify her of this.   [facility] outpatient pharmacy and spoke with Manuela Schwartz who informed me that she did see the Rx but never spoke to [facility] regarding this being picked up with them. Manuela Schwartz is contacting patient to let her know   of picking up Lupron at [facility], states this will be ready tomorrow.   Called and left voicemail for patient to let her know that medication will be ready for pick up at [facility] and that they will be calling her as well, phone number provided for additional support. Encouraged patient to call our office with any additional questions.   Altha Harm, [facility]  02/25/21

## 2021-02-26 ENCOUNTER — Other Ambulatory Visit (HOSPITAL_COMMUNITY): Payer: Self-pay

## 2021-03-01 ENCOUNTER — Other Ambulatory Visit (HOSPITAL_COMMUNITY): Payer: Self-pay

## 2021-03-02 ENCOUNTER — Other Ambulatory Visit (HOSPITAL_COMMUNITY): Payer: Self-pay

## 2021-03-04 ENCOUNTER — Other Ambulatory Visit: Payer: Self-pay

## 2021-03-04 NOTE — Progress Notes (Signed)
Call placed for PA for [facility] Lupron. Will not be covered with insurance and PA not approved. Will need to send Depo Lupron application for approval.  Will inform Dr Elgie Congo and pt.   Colletta Maryland, RN

## 2021-03-05 ENCOUNTER — Other Ambulatory Visit (HOSPITAL_COMMUNITY): Payer: Self-pay

## 2021-03-05 ENCOUNTER — Telehealth: Payer: Self-pay

## 2021-03-05 ENCOUNTER — Telehealth: Payer: Self-pay | Admitting: *Deleted

## 2021-03-05 ENCOUNTER — Encounter: Payer: Self-pay | Admitting: *Deleted

## 2021-03-05 NOTE — Telephone Encounter (Signed)
Call to patient regarding surgery date. Surgery scheduled on 04-27-21 at 1pm at [facility]Advised to plan arrival by 11am. Advised will receive letter in mail and pre-op call. Has not had Lupron injection yet due to issue with new insurance. Advised to let us know if needs financial aid application for Lupron.

## 2021-03-05 NOTE — Telephone Encounter (Signed)
Call placed to pt. Pt advised of OOP cost for Lupron per Insurance. Pt verbalized unable to pay this much out of pocket. Pt advised will send Lupron application and financial assistance form to her address on file. Pt agreeable and advised to return forms to office as soon as possible to continue the process. Pt verbalized understanding and agreeable to plan of care.   Colletta Maryland, RN

## 2021-03-10 ENCOUNTER — Other Ambulatory Visit (HOSPITAL_COMMUNITY): Payer: Self-pay

## 2021-03-11 ENCOUNTER — Ambulatory Visit: Payer: No Typology Code available for payment source | Admitting: Obstetrics & Gynecology

## 2021-03-12 ENCOUNTER — Other Ambulatory Visit (HOSPITAL_COMMUNITY): Payer: Self-pay

## 2021-03-16 ENCOUNTER — Other Ambulatory Visit (HOSPITAL_COMMUNITY): Payer: Self-pay

## 2021-03-16 MED ORDER — LOSARTAN POTASSIUM 25 MG PO TABS
25.0000 mg | ORAL_TABLET | Freq: Every day | ORAL | 5 refills | Status: DC
  Filled 2021-03-16: qty 30, 30d supply, fill #0

## 2021-03-16 MED ORDER — HYDROCHLOROTHIAZIDE 12.5 MG PO CAPS
12.5000 mg | ORAL_CAPSULE | Freq: Every day | ORAL | 1 refills | Status: DC
  Filled 2021-03-16: qty 30, 30d supply, fill #0
  Filled 2021-12-20: qty 30, 30d supply, fill #1

## 2021-03-25 ENCOUNTER — Other Ambulatory Visit (HOSPITAL_COMMUNITY): Payer: Self-pay

## 2021-03-29 ENCOUNTER — Other Ambulatory Visit: Payer: Self-pay | Admitting: Pharmacist

## 2021-03-29 ENCOUNTER — Other Ambulatory Visit (HOSPITAL_COMMUNITY): Payer: Self-pay

## 2021-03-29 DIAGNOSIS — D259 Leiomyoma of uterus, unspecified: Secondary | ICD-10-CM

## 2021-03-29 MED ORDER — LEUPROLIDE ACETATE 3.75 MG IM KIT
3.7500 mg | PACK | INTRAMUSCULAR | 2 refills | Status: DC
Start: 2021-03-29 — End: 2021-05-30
  Filled 2021-03-29 – 2021-03-31 (×2): qty 1, 28d supply, fill #0

## 2021-03-31 ENCOUNTER — Other Ambulatory Visit (HOSPITAL_COMMUNITY): Payer: Self-pay

## 2021-04-02 ENCOUNTER — Telehealth: Payer: Self-pay | Admitting: *Deleted

## 2021-04-02 NOTE — Telephone Encounter (Signed)
Call from patient. Unable to proceed with Lupron. Desires to try proceeding with surgery but has financial needs during post-operative period. Advised will review with Education officer, museum for available resources.

## 2021-04-06 NOTE — Telephone Encounter (Signed)
Voice mail from patient requesting call about surgery.  Return call to patient. Left message retruning her call.

## 2021-04-08 NOTE — Telephone Encounter (Signed)
Voice mail from patient to call. Return call to patient. Left message to call back.

## 2021-04-13 ENCOUNTER — Telehealth: Payer: Self-pay | Admitting: Clinical

## 2021-04-13 ENCOUNTER — Telehealth: Payer: Self-pay

## 2021-04-13 ENCOUNTER — Other Ambulatory Visit: Payer: Self-pay

## 2021-04-13 ENCOUNTER — Encounter (HOSPITAL_COMMUNITY): Payer: Self-pay

## 2021-04-13 ENCOUNTER — Other Ambulatory Visit (HOSPITAL_COMMUNITY): Payer: Self-pay

## 2021-04-13 ENCOUNTER — Ambulatory Visit (HOSPITAL_COMMUNITY)
Admission: EM | Admit: 2021-04-13 | Discharge: 2021-04-13 | Disposition: A | Discharge status: Home / Self Care | Payer: No Typology Code available for payment source | Attending: Student | Admitting: Student

## 2021-04-13 DIAGNOSIS — J309 Allergic rhinitis, unspecified: Secondary | ICD-10-CM

## 2021-04-13 DIAGNOSIS — J0100 Acute maxillary sinusitis, unspecified: Secondary | ICD-10-CM

## 2021-04-13 MED ORDER — AMOXICILLIN 875 MG PO TABS
875.0000 mg | ORAL_TABLET | Freq: Two times a day (BID) | ORAL | 0 refills | Status: AC
  Filled 2021-04-14: qty 14, 7d supply, fill #0

## 2021-04-13 MED ORDER — ONDANSETRON 8 MG PO TBDP
8.0000 mg | ORAL_TABLET | Freq: Three times a day (TID) | ORAL | 0 refills | Status: DC | PRN
  Filled 2021-04-14: qty 20, 7d supply, fill #0

## 2021-04-13 NOTE — Discharge Instructions (Addendum)
-  Start the antibiotic-Amoxicillin, 1 pill every 12 hours for 7 days.  You can take this with food like with breakfast and dinner. -Take the Zofran (ondansetron) up to 3 times daily for nausea and vomiting. Dissolve one pill under your tongue or between your teeth and your cheek. -Flonase nasal steroid: place 2 sprays into both nostrils in the morning and at bedtime for at least 7 days. Continue for longer if this is helping. -Over-the-counter allergy medication

## 2021-04-13 NOTE — Telephone Encounter (Signed)
Pt requested second call via MyChart. Called pt. Patient states she was wanting to postpone the surgery until mid to late October. Would like to know if Dr. Elgie Congo' recommendation for Depo Lupron would be different if she had the surgery end of October vs 04/27/21 as previously scheduled. Pt requests recommendation from provider if risk of surgery would be decreased by first taking Lupron. Pt also asks if she would need another surgery consult if this is delayed. Pt reports completing application and returning to the office. States out of pocket cost for Lupron will not be affordable without financial assistance. Explained to pt I will call back once I have spoken with all involved parties.  Called pharmacy. They stated that her out of pocket cost for the 3.75 mg is $1600 and 11.25 mg is $4000. Called [employer] (patient assistance program), they requested updated provider information, this was given during telephone call. They stated our office will be notified via fax of application result in 2-3 days.  Spoke with [facility], who said that she does not have anything to offer the patient as far as financial resources.   Following message received with follow up information: Alyssa Basil, MD  Alyssa Howells, RN If she wants October she will h as to go through [facility] again. She does the scheduling. I would do a one month lupron if she wants to wait. All the lupron will do is decrease the size of the fibroids and possibly not have to make as large of an incision. It will have no bearing on her recovery once the uterus is out.   Called pt with update; VM left stating I will attempt to contact the patient tomorrow.

## 2021-04-13 NOTE — ED Triage Notes (Signed)
Pt reports pounding headache, back of the neck and sinus pressure  x 8 days. Pt reports her right eye feels likes droop.

## 2021-04-13 NOTE — Telephone Encounter (Signed)
Called pt to follow up on financial application for Depo Lupron. Per Morey Hummingbird, RN this has been mailed to patient. VM left requesting a callback or a response to MyChart message. MyChart message sent.

## 2021-04-13 NOTE — Telephone Encounter (Signed)
Left HIPPA-compliant message to call back Muslima Toppins from [facility] at  [phone] (Miachel Nardelli's office).   

## 2021-04-13 NOTE — ED Provider Notes (Signed)
[place]    CSN: [id number] Arrival date & time: 04/13/21  1750      History   Chief Complaint Chief Complaint  Patient presents with  . Headache    HPI Alyssa Sullivan is a 40 y.o. female presenting with 1 week of progressively worsening facial pain.  Medical history asthma, cat allergy.  Taking over-the-counter allergy medication for relief.  Denies recent URI, fever/chills.  Pain radiating down the right side of her neck for about 1 day. Denies fevers/chills, n/v/d, shortness of breath, chest pain, cough, teeth pain, headaches, sore throat, loss of taste/smell, swollen lymph nodes, ear pain.    HPI  Past Medical History:  Diagnosis Date  . Asthma   . Fibroids   . Heel spur   . Hypertension   . Plantar fasciitis     Patient Active Problem List   Diagnosis Date Noted  . Menorrhagia 02/03/2021  . Diverticulosis 01/07/2021  . Uterine fibroid 01/07/2021  . Abdominal pain 01/07/2021  . Screening examination for STD (sexually transmitted disease) 01/07/2021  . Chronic hip pain 02/21/2013  . Low back pain with sciatica 02/21/2013    Past Surgical History:  Procedure Laterality Date  . MOUTH SURGERY      OB History   No obstetric history on file.      Home Medications    Prior to Admission medications   Medication Sig Start Date End Date Taking? Authorizing Provider  amoxicillin (AMOXIL) 875 MG tablet Take 1 tablet (875 mg total) by mouth 2 (two) times daily for 7 days. 04/13/21 04/20/21 Yes Hazel Sams, PA-C  ondansetron (ZOFRAN ODT) 8 MG disintegrating tablet Take 1 tablet (8 mg total) by mouth every 8 (eight) hours as needed for nausea or vomiting. 04/13/21  Yes Hazel Sams, PA-C  cetirizine (ZYRTEC) 10 MG tablet Take 10 mg by mouth daily.    [provider]  Cyanocobalamin (VITAMIN B-12 PO) Take 1 tablet by mouth daily.    [provider]  gabapentin (NEURONTIN) 100 MG capsule Take 1 capsule (100 mg total) by mouth 3  (three) times daily. 10/18/19 11/17/19  Henderly, Britni A, PA-C  hydrochlorothiazide (MICROZIDE) 12.5 MG capsule Take 1 capsule (12.5 mg total) by mouth daily. 03/16/21     ibuprofen (ADVIL) 800 MG tablet Take 1 tablet (800 mg total) by mouth every 8 (eight) hours as needed. 01/07/21   Griffin Basil, MD  leuprolide (LUPRON DEPOT, 14-MONTH,) 11.25 MG injection Inject 11.25 mg into the muscle every 3 (three) months. 02/03/21   Griffin Basil, MD  leuprolide (LUPRON) 3.75 MG injection Inject 3.75 mg into the muscle every 28 (twenty-eight) days. 03/29/21   Griffin Basil, MD  losartan (COZAAR) 25 MG tablet Take 1 tablet (25 mg total) by mouth daily. 03/16/21     Melatonin 10 MG CAPS Take 10 mg by mouth at bedtime as needed (sleep).    [provider]  Multiple Vitamin (MULTIVITAMIN ADULT PO) Take 1 tablet by mouth daily.    [provider]    Family History History reviewed. No pertinent family history.  Social History Social History   Tobacco Use  . Smoking status: Every Day    Types: Cigarettes  . Smokeless tobacco: Never  Substance Use Topics  . Alcohol use: Yes    Comment:  wine daily  . Drug use: No     Allergies   Banana, Codeine, Cranberry, Fig extract [ficus], and Penicillins   Review of Systems Review  of Systems  Constitutional:  Negative for appetite change, chills and fever.  HENT:  Positive for sinus pain. Negative for congestion, ear pain, rhinorrhea, sinus pressure and sore throat.   Eyes:  Negative for redness and visual disturbance.  Respiratory:  Negative for cough, chest tightness, shortness of breath and wheezing.   Cardiovascular:  Negative for chest pain and palpitations.  Gastrointestinal:  Negative for abdominal pain, constipation, diarrhea, nausea and vomiting.  Genitourinary:  Negative for dysuria, frequency and urgency.  Musculoskeletal:  Negative for myalgias.  Neurological:  Negative for dizziness, weakness and headaches.   Psychiatric/Behavioral:  Negative for confusion.   All other systems reviewed and are negative.   Physical Exam Triage Vital Signs ED Triage Vitals  Enc Vitals Group     BP 04/13/21 1847 (!) 141/96     Pulse Rate 04/13/21 1847 99     Resp 04/13/21 1847 18     Temp 04/13/21 1847 99.2 F (37.3 C)     Temp Source 04/13/21 1847 Oral     SpO2 04/13/21 1847 100 %     Weight --      Height --      Head Circumference --      Peak Flow --      Pain Score 04/13/21 1850 8     Pain Loc --      Pain Edu? --      Excl. in [place]? --    No data found.  Updated Vital Signs BP (!) 141/96 (BP Location: Right Arm)   Pulse 99   Temp 99.2 F (37.3 C) (Oral)   Resp 18   LMP  (LMP Unknown)   SpO2 100%   Visual Acuity Right Eye Distance:   Left Eye Distance:   Bilateral Distance:    Right Eye Near:   Left Eye Near:    Bilateral Near:     Physical Exam Vitals reviewed.  Constitutional:      General: She is not in acute distress.    Appearance: Normal appearance. She is not ill-appearing.  HENT:     Head: Normocephalic and atraumatic.     Right Ear: Tympanic membrane, ear canal and external ear normal. No tenderness. No middle ear effusion. There is no impacted cerumen. Tympanic membrane is not perforated, erythematous, retracted or bulging.     Left Ear: Tympanic membrane, ear canal and external ear normal. No tenderness.  No middle ear effusion. There is no impacted cerumen. Tympanic membrane is not perforated, erythematous, retracted or bulging.     Nose: No congestion.     Right Sinus: Maxillary sinus tenderness and frontal sinus tenderness present.     Left Sinus: No maxillary sinus tenderness or frontal sinus tenderness.     Mouth/Throat:     Mouth: Mucous membranes are moist.     Pharynx: Uvula midline. No oropharyngeal exudate or posterior oropharyngeal erythema.  Eyes:     Extraocular Movements: Extraocular movements intact.     Pupils: Pupils are equal, round, and reactive  to light.  Cardiovascular:     Rate and Rhythm: Normal rate and regular rhythm.     Heart sounds: Normal heart sounds.  Pulmonary:     Effort: Pulmonary effort is normal.     Breath sounds: Normal breath sounds. No decreased breath sounds, wheezing, rhonchi or rales.  Abdominal:     Palpations: Abdomen is soft.     Tenderness: There is no abdominal tenderness. There is no guarding or rebound.  Neurological:  General: No focal deficit present.     Mental Status: She is alert and oriented to person, place, and time.  Psychiatric:        Mood and Affect: Mood normal.        Behavior: Behavior normal.        Thought Content: Thought content normal.        Judgment: Judgment normal.     UC Treatments / Results  Labs (all labs ordered are listed, but only abnormal results are displayed) Labs Reviewed - No data to display  EKG   Radiology No results found.  Procedures Procedures (including critical care time)  Medications Ordered in UC Medications - No data to display  Initial Impression / Assessment and Plan / UC Course  I have reviewed the triage vital signs and the nursing notes.  Pertinent labs & imaging results that were available during my care of the patient were reviewed by me and considered in my medical decision making (see chart for details).     This patient is a very pleasant 40 y.o. year old female presenting with acute sinusitis. Today this pt is afebrile nontachycardic nontachypneic, oxygenating well on room air, no wheezes rhonchi or rales.   Amoxicillin, flonase.  Chart review shows history of amoxicillin allergy, patient states that she has taken this multiple times without issue.   ED return precautions discussed. Patient verbalizes understanding and agreement.    Final Clinical Impressions(s) / UC Diagnoses   Final diagnoses:  Acute non-recurrent maxillary sinusitis  Allergic rhinitis, unspecified seasonality, unspecified trigger      Discharge Instructions      -Start the antibiotic-Amoxicillin, 1 pill every 12 hours for 7 days.  You can take this with food like with breakfast and dinner. -Take the Zofran (ondansetron) up to 3 times daily for nausea and vomiting. Dissolve one pill under your tongue or between your teeth and your cheek. -Flonase nasal steroid: place 2 sprays into both nostrils in the morning and at bedtime for at least 7 days. Continue for longer if this is helping. -Over-the-counter allergy medication    ED Prescriptions     Medication Sig Dispense Auth. Provider   amoxicillin (AMOXIL) 875 MG tablet Take 1 tablet (875 mg total) by mouth 2 (two) times daily for 7 days. 14 tablet Hazel Sams, PA-C   ondansetron (ZOFRAN ODT) 8 MG disintegrating tablet Take 1 tablet (8 mg total) by mouth every 8 (eight) hours as needed for nausea or vomiting. 20 tablet Hazel Sams, PA-C      PDMP not reviewed this encounter.   Hazel Sams, PA-C 04/13/21 1943

## 2021-04-14 ENCOUNTER — Other Ambulatory Visit (HOSPITAL_COMMUNITY): Payer: Self-pay

## 2021-04-14 NOTE — Telephone Encounter (Addendum)
Called pt; reviewed information learned yesterday, see earlier note. Pt given provider recommendation. Pt would like to wait for final decision on plan of care once decision from [facility] regarding medication assistance. Elgie Congo, MD given update. Will update Gay Filler, RN once she has returned to office.

## 2021-04-14 NOTE — Telephone Encounter (Signed)
Called pt; VM left requesting pt return phone call and leave VM on nurse line stating best time to contact.

## 2021-04-16 NOTE — Telephone Encounter (Signed)
Called [facility]. Spoke with patient representative who states pt was approved for Depo Lupron 3.75 mg injection. This should arrive to our office in 7-10 days. Called pt with update. Explained we will schedule nurse visit for injection as soon as we receive medication. Pt states she would like to reschedule surgery to the end of October. Message sent to Elgie Congo, MD and Lamont Snowball, RN with update.

## 2021-04-20 NOTE — Telephone Encounter (Signed)
Patient is now receiving one month dose of Lupron and desires to reschedule surgery. Reviewed with Dr Elgie Congo and date options in coordination with Lupron discussed. Patient is agreeable to 10-25 date. Will call back once scheduled.  Encounter closed.

## 2021-04-22 NOTE — Pre-Procedure Instructions (Signed)
Surgical Instructions:    Your procedure is scheduled on Tuesday, October 25th (07:30 AM- 09:10 AM).  Report to [facility] Main Entrance "A" at 05:30 A.M., then check in with the Admitting office.  Call this number if you have any questions prior to your surgery date, or have problems the morning of surgery:  [phone]    Remember:  Do not eat after midnight the night before your surgery.  You may drink clear liquids until 04:30 AM the morning of your surgery.   Clear liquids allowed are: Water, Non-Citrus Juices (without pulp), Carbonated Beverages, Clear Tea, Black Coffee Only, and Gatorade.    Take these medicines the morning of surgery with A SIP OF WATER:  cetirizine (ZYRTEC)  gabapentin (NEURONTIN)  IF NEEDED: ondansetron (ZOFRAN ODT)   As of today, STOP taking any Aspirin (unless otherwise instructed by your surgeon) Aleve, Naproxen, Ibuprofen, Motrin, Advil, Goody's, BC's, all herbal medications, fish oil, and all vitamins.             Special instructions:    [place]- Preparing For Surgery  Before surgery, you can play an important role. Because skin is not sterile, your skin needs to be as free of germs as possible. You can reduce the number of germs on your skin by washing with CHG (chlorahexidine gluconate) Soap before surgery.  CHG is an antiseptic cleaner which kills germs and bonds with the skin to continue killing germs even after washing.     Please do not use if you have an allergy to CHG or antibacterial soaps. If your skin becomes reddened/irritated stop using the CHG.  Do not shave (including legs and underarms) for at least 48 hours prior to first CHG shower. It is OK to shave your face.  Please follow these instructions carefully.     Shower the NIGHT BEFORE SURGERY and the MORNING OF SURGERY with CHG Soap.   If you chose to wash your hair, wash your hair first as usual with your normal shampoo. After you shampoo, rinse your hair and body  thoroughly to remove the shampoo.  Then [employer] and genitals (private parts) with your normal soap and rinse thoroughly to remove soap.  After that Use CHG Soap as you would any other liquid soap. You can apply CHG directly to the skin and wash gently with a clean washcloth.   Apply the CHG Soap to your body ONLY FROM THE NECK DOWN.  Do not use on open wounds or open sores. Avoid contact with your eyes, ears, mouth and genitals (private parts). Wash Face and genitals (private parts)  with your normal soap.   Wash thoroughly, paying special attention to the area where your surgery will be performed.  Thoroughly rinse your body with warm water from the neck down.  DO NOT shower/wash with your normal soap after using and rinsing off the CHG Soap.  Pat yourself dry with a CLEAN TOWEL.  Wear CLEAN PAJAMAS to bed the night before surgery.  Place CLEAN SHEETS on your bed the night before your surgery.  DO NOT SLEEP WITH PETS.   Day of Surgery:  Take a shower with CHG soap. Wear Clean/Comfortable clothing the morning of surgery Do not wear lotions, powders, perfumes, or deodorant.   Remember to brush your teeth WITH YOUR REGULAR TOOTHPASTE. Do not wear jewelry or makeup. DO Not wear nail polish, gel polish, artificial nails, or any other type of covering on natural nails including finger and toenails. If patients have  artificial nails, gel coating, etc. that need to be removed by a nail salon please have this removed prior to surgery or surgery may need to be canceled/delayed if the surgeon/ anesthesia feels like the patient is unable to be adequately monitored. Do not shave 48 hours prior to surgery.   Do not bring valuables to the hospital. [facility] is not responsible for any belongings or valuables.  Do NOT Smoke (Tobacco/Vaping) or drink Alcohol 24 hours prior to your procedure.  If you use a CPAP at night, you may bring all equipment for your overnight stay.   Contacts,  glasses, dentures or bridgework may not be worn into surgery, please bring cases for these belongings.   For patients admitted to the hospital, discharge time will be determined by your treatment team.  Patients discharged the day of surgery will not be allowed to drive home, and someone needs to stay with them for 24 hours.  NO VISITORS WILL BE ALLOWED IN PRE-OP WHERE PATIENTS GET READY FOR SURGERY.  ONLY 1 SUPPORT PERSON MAY BE PRESENT WHILE YOU ARE IN SURGERY.  IF YOU ARE TO BE ADMITTED, ONCE YOU ARE IN YOUR ROOM YOU WILL BE ALLOWED TWO (2) VISITORS.  Minor children may have two parents present. Special consideration for safety and communication needs will be reviewed on a case by case basis.    Please read over the following fact sheets that you were given.

## 2021-04-22 NOTE — Progress Notes (Signed)
[employer] sent to Dr. Elgie Congo requesting procedure orders.

## 2021-04-23 ENCOUNTER — Inpatient Hospital Stay (HOSPITAL_COMMUNITY)
Admission: RE | Admit: 2021-04-23 | Discharge: 2021-04-23 | Disposition: A | Discharge status: Home / Self Care | Payer: No Typology Code available for payment source | Source: Ambulatory Visit

## 2021-04-26 NOTE — Addendum Note (Signed)
Addended by: Griffin Basil on: 04/26/2021 01:15 PM   Modules accepted: Orders, SmartSet

## 2021-04-27 ENCOUNTER — Encounter (HOSPITAL_COMMUNITY): Payer: Self-pay

## 2021-04-27 ENCOUNTER — Ambulatory Visit (INDEPENDENT_AMBULATORY_CARE_PROVIDER_SITE_OTHER): Payer: No Typology Code available for payment source | Admitting: General Practice

## 2021-04-27 ENCOUNTER — Encounter (HOSPITAL_COMMUNITY)
Admission: RE | Admit: 2021-04-27 | Discharge: 2021-04-27 | Disposition: A | Discharge status: Home / Self Care | Payer: No Typology Code available for payment source | Source: Ambulatory Visit | Attending: Obstetrics and Gynecology | Admitting: Obstetrics and Gynecology

## 2021-04-27 ENCOUNTER — Other Ambulatory Visit: Payer: Self-pay

## 2021-04-27 VITALS — BP 138/108 | Temp 79.0°F | Ht 68.0 in | Wt 202.0 lb

## 2021-04-27 DIAGNOSIS — Z01812 Encounter for preprocedural laboratory examination: Secondary | ICD-10-CM | POA: Insufficient documentation

## 2021-04-27 DIAGNOSIS — D259 Leiomyoma of uterus, unspecified: Secondary | ICD-10-CM

## 2021-04-27 HISTORY — DX: Anxiety disorder, unspecified: F41.9

## 2021-04-27 HISTORY — DX: Depression, unspecified: F32.A

## 2021-04-27 HISTORY — DX: Thyrotoxicosis, unspecified without thyrotoxic crisis or storm: E05.90

## 2021-04-27 HISTORY — DX: Myoneural disorder, unspecified: G70.9

## 2021-04-27 HISTORY — DX: Gastro-esophageal reflux disease without esophagitis: K21.9

## 2021-04-27 LAB — BASIC METABOLIC PANEL
Anion gap: 6 (ref 5–15)
BUN: 10 mg/dL (ref 6–20)
CO2: 24 mmol/L (ref 22–32)
Calcium: 9.3 mg/dL (ref 8.9–10.3)
Chloride: 104 mmol/L (ref 98–111)
Creatinine, Ser: 0.78 mg/dL (ref 0.44–1.00)
GFR, Estimated: >60 mL/min (ref >60)
Glucose, Bld: 99 mg/dL (ref 70–99)
Potassium: 4.5 mmol/L (ref 3.5–5.1)
Sodium: 134 mmol/L — ABNORMAL LOW (ref 135–145)

## 2021-04-27 LAB — CBC
HCT: 44.9 % (ref 36.0–46.0)
Hemoglobin: 14.7 g/dL (ref 12.0–15.0)
MCH: 31.3 pg (ref 26.0–34.0)
MCHC: 32.7 g/dL (ref 30.0–36.0)
MCV: 95.7 fL (ref 80.0–100.0)
Platelets: 258 10*3/uL (ref 150–400)
RBC: 4.69 MIL/uL (ref 3.87–5.11)
RDW: 13.9 % (ref 11.5–15.5)
WBC: 5.1 10*3/uL (ref 4.0–10.5)
nRBC: 0 % (ref 0.0–0.2)

## 2021-04-27 LAB — ABO/RH: ABO/RH(D): O POS

## 2021-04-27 MED ORDER — LEUPROLIDE ACETATE 3.75 MG IM KIT
3.7500 mg | PACK | Freq: Once | INTRAMUSCULAR | Status: AC
  Administered 2021-04-27: 3.75 mg via INTRAMUSCULAR

## 2021-04-27 NOTE — Progress Notes (Addendum)
Upon doing the suicide risk assessment patient answered yes to the following questions.  In the past month, have you wished you were dead or wished you could go to sleep and not wake up? Yes In the past month, have you actually had any thoughts of killing yourself? Yes Contacted Denyse Amass with social work who stated Lu Duffel will come see the patient. Margreta Journey arrived to Short Stay and met with patient in room 1. This nurse also called patient's surgeon's office- Dr. Elgie Congo and spoke to [place]. Lorriane Shire stated she will notify Dr. Elgie Congo of patient's answers to suicide questions and have the [place] from their office see the patient as well since the patient has a 3:00 appointment with Dr. Elgie Congo today. C. [place]ale stated she will enter a note regarding her visit with the patient.

## 2021-04-27 NOTE — Pre-Procedure Instructions (Addendum)
Surgical Instructions:    Your procedure is scheduled on Tuesday, October 25th (07:30 AM- 09:10 AM).  Report to [facility] Main Entrance "A" at 05:30 A.M., then check in with the Admitting office.  Call this number if you have any questions prior to your surgery date, or have problems the morning of surgery:  [phone]    Remember:  Do not eat or drink after midnight the night before your surgery.    Take these medicines the morning of surgery with A SIP OF WATER:  cetirizine (ZYRTEC)    IF NEEDED: ondansetron (ZOFRAN ODT)   As of today, STOP taking any Aspirin (unless otherwise instructed by your surgeon) Aleve, Naproxen, Ibuprofen, Motrin, Advil, Goody's, BC's, all herbal medications, fish oil, and all vitamins.             Special instructions:    [place]- Preparing For Surgery  Before surgery, you can play an important role. Because skin is not sterile, your skin needs to be as free of germs as possible. You can reduce the number of germs on your skin by washing with CHG (chlorahexidine gluconate) Soap before surgery.  CHG is an antiseptic cleaner which kills germs and bonds with the skin to continue killing germs even after washing.     Please do not use if you have an allergy to CHG or antibacterial soaps. If your skin becomes reddened/irritated stop using the CHG.  Do not shave (including legs and underarms) for at least 48 hours prior to first CHG shower. It is OK to shave your face.  Please follow these instructions carefully.     Shower the NIGHT BEFORE SURGERY and the MORNING OF SURGERY with CHG Soap.   If you chose to wash your hair, wash your hair first as usual with your normal shampoo. After you shampoo, rinse your hair and body thoroughly to remove the shampoo.  Then [employer] and genitals (private parts) with your normal soap and rinse thoroughly to remove soap.  After that Use CHG Soap as you would any other liquid soap. You can apply CHG directly to  the skin and wash gently with a clean washcloth.   Apply the CHG Soap to your body ONLY FROM THE NECK DOWN.  Do not use on open wounds or open sores. Avoid contact with your eyes, ears, mouth and genitals (private parts). Wash Face and genitals (private parts)  with your normal soap.   Wash thoroughly, paying special attention to the area where your surgery will be performed.  Thoroughly rinse your body with warm water from the neck down.  DO NOT shower/wash with your normal soap after using and rinsing off the CHG Soap.  Pat yourself dry with a CLEAN TOWEL.  Wear CLEAN PAJAMAS to bed the night before surgery.  Place CLEAN SHEETS on your bed the night before your surgery.  DO NOT SLEEP WITH PETS.   Day of Surgery:  Take a shower with CHG soap. Wear Clean/Comfortable clothing the morning of surgery Do not wear lotions, powders, perfumes, or deodorant.   Remember to brush your teeth WITH YOUR REGULAR TOOTHPASTE. Do not wear jewelry or makeup. DO Not wear nail polish, gel polish, artificial nails, or any other type of covering on natural nails including finger and toenails. If patients have artificial nails, gel coating, etc. that need to be removed by a nail salon please have this removed prior to surgery or surgery may need to be canceled/delayed if the surgeon/ anesthesia feels  like the patient is unable to be adequately monitored. Do not shave 48 hours prior to surgery.   Do not bring valuables to the hospital. [facility] is not responsible for any belongings or valuables.  Do NOT Smoke (Tobacco/Vaping) or drink Alcohol 24 hours prior to your procedure.  If you use a CPAP at night, you may bring all equipment for your overnight stay.   Contacts, glasses, dentures or bridgework may not be worn into surgery, please bring cases for these belongings.   For patients admitted to the hospital, discharge time will be determined by your treatment team.  Patients discharged the day of  surgery will not be allowed to drive home, and someone needs to stay with them for 24 hours.  NO VISITORS WILL BE ALLOWED IN PRE-OP WHERE PATIENTS GET READY FOR SURGERY.  ONLY 1 SUPPORT PERSON MAY BE PRESENT WHILE YOU ARE IN SURGERY.  IF YOU ARE TO BE ADMITTED, ONCE YOU ARE IN YOUR ROOM YOU WILL BE ALLOWED TWO (2) VISITORS.  Minor children may have two parents present. Special consideration for safety and communication needs will be reviewed on a case by case basis.    Please read over the following fact sheets that you were given.

## 2021-04-27 NOTE — Progress Notes (Signed)
CSW spoke with pt to assess suicidal comments that she made during her pre admission appointment. Pt reported making suicidal comments due to feeling overwhelmed. Pt denies having a specific plan to commit suicide. Pt stated everything was happening at once and felt overwhelmed. Pt reported she loves her dogs and would never leave them. Pt reported seeing a therapist in the past but was unable to continue to pay. Pt reported she recently went to [facility] behavioral health to inquire about therapy. She reported having an appointment next week to meet her new therapist. Pt wanted to end meeting due to her next appointment, and verbalized she will go to he behavioral health appointment.  Alyssa Sullivan, MSW, [place]  Transitions of Care Clinical Social Worker I Direct Dial: [phone]  Fax: [phone] [email or web address]

## 2021-04-27 NOTE — Progress Notes (Signed)
Alyssa Sullivan here for  Lupron   Injection.  Injection administered without complication. Patient will return for surgery 10/25 .  Derinda Late, RN 04/27/2021  4:52 PM

## 2021-04-27 NOTE — Progress Notes (Signed)
PCP - Dr. Armanda Heritage Cardiologist - denies  PPM/ICD - n/a Device Orders - n/a Rep Notified - n/a  Chest x-ray - 06/23/20 (CE) EKG - 06/23/20 (CE) Stress Test - denies ECHO - denies Cardiac Cath - denies  Sleep Study - denies CPAP - n/a  Fasting Blood Sugar - n/a Checks Blood Sugar __ n/a__times a day  Blood Thinner Instructions: n/a Aspirin Instructions: n/a  ERAS Protcol - No PRE-SURGERY Ensure or G2- n/a  COVID TEST- Scheduled for 05/21/21 at 10 am. Patient is aware of date, time, location, and verbalized understanding.    Anesthesia review: Yes. BP 143/101 upon arrival to PAT and 137/108 when rechecked. Patient states she did not take hydrochlorothiazide last night as she takes her meds nightly. Sigmund Hazel, PA made aware. Patient instructed to check her BP daily, take meds as prescribed,  and if her diastolic is consistently above 100 then she needs to notify her PCP. Patient verbalized understanding.   Patient denies shortness of breath, fever, cough and chest pain at PAT appointment   All instructions explained to the patient, with a verbal understanding of the material. Patient agrees to go over the instructions while at home for a better understanding. Patient also instructed to self quarantine after being tested for COVID-19. The opportunity to ask questions was provided.

## 2021-04-29 ENCOUNTER — Ambulatory Visit (HOSPITAL_COMMUNITY): Payer: Self-pay | Admitting: Clinical
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
## 2021-05-06 ENCOUNTER — Ambulatory Visit (HOSPITAL_COMMUNITY): Payer: Self-pay | Admitting: Clinical

## 2021-05-12 ENCOUNTER — Other Ambulatory Visit: Payer: Self-pay

## 2021-05-12 ENCOUNTER — Other Ambulatory Visit (HOSPITAL_COMMUNITY): Payer: Self-pay

## 2021-05-12 DIAGNOSIS — R109 Unspecified abdominal pain: Secondary | ICD-10-CM

## 2021-05-12 MED ORDER — CYCLOBENZAPRINE HCL 10 MG PO TABS
10.0000 mg | ORAL_TABLET | Freq: Three times a day (TID) | ORAL | 0 refills | Status: DC | PRN
  Filled 2021-05-12: qty 20, 7d supply, fill #0

## 2021-05-12 MED ORDER — IBUPROFEN 800 MG PO TABS
800.0000 mg | ORAL_TABLET | Freq: Three times a day (TID) | ORAL | 1 refills | Status: AC | PRN
  Filled 2021-05-12: qty 60, 20d supply, fill #0

## 2021-05-12 NOTE — Progress Notes (Signed)
Reviewed Mychart with Dr Elgie Congo. Advised to give pt Rx Flexeril 10mg  TID PRN. Spoke with pt. Pt has taken Flexeril in the past and tolerated well.  Pt also requesting Rx Ibuprofen. Rx sent to pharmacy on file.  Pt thankful for quick response and help!  Colletta [place], RN

## 2021-05-21 ENCOUNTER — Other Ambulatory Visit (HOSPITAL_COMMUNITY)
Admission: RE | Admit: 2021-05-21 | Discharge: 2021-05-21 | Disposition: A | Discharge status: Home / Self Care | Payer: No Typology Code available for payment source | Source: Ambulatory Visit | Attending: Obstetrics and Gynecology | Admitting: Obstetrics and Gynecology

## 2021-05-21 ENCOUNTER — Encounter (HOSPITAL_COMMUNITY): Payer: Self-pay | Admitting: Vascular Surgery

## 2021-05-21 DIAGNOSIS — Z01818 Encounter for other preprocedural examination: Secondary | ICD-10-CM

## 2021-05-21 DIAGNOSIS — Z20822 Contact with and (suspected) exposure to covid-19: Secondary | ICD-10-CM | POA: Insufficient documentation

## 2021-05-21 DIAGNOSIS — Z01812 Encounter for preprocedural laboratory examination: Secondary | ICD-10-CM | POA: Insufficient documentation

## 2021-05-21 LAB — SARS CORONAVIRUS 2 (TAT 6-24 HRS): SARS Coronavirus 2: NEGATIVE

## 2021-05-24 ENCOUNTER — Encounter (HOSPITAL_COMMUNITY): Payer: Self-pay

## 2021-05-24 NOTE — Anesthesia Preprocedure Evaluation (Addendum)
Anesthesia Evaluation  Patient identified by MRN, date of birth, ID band Patient awake    Reviewed: Allergy & Precautions, NPO status , Patient's Chart, lab work & pertinent test results  Airway Mallampati: I  TM Distance: >3 FB Neck ROM: Limited    Dental  (+) Poor Dentition, Missing, Dental Advisory Given,    Pulmonary Current Smoker and Patient abstained from smoking.,    Pulmonary exam normal        Cardiovascular hypertension, Pt. on medications Normal cardiovascular exam     Neuro/Psych PSYCHIATRIC DISORDERS Anxiety Depression Patient reports bilateral tingling down both arms with neck extension. Per patient, she has a slipped disk in her neck.  Neuromuscular disease    GI/Hepatic (+)     substance abuse  alcohol use and marijuana use,   Endo/Other    Renal/GU negative Renal ROS  negative genitourinary   Musculoskeletal negative musculoskeletal ROS (+)   Abdominal (+) + obese,   Peds  Hematology   Anesthesia Other Findings   Reproductive/Obstetrics                          Anesthesia Physical Anesthesia Plan  ASA: 2  Anesthesia Plan: General   Post-op Pain Management:    Induction: Intravenous  PONV Risk Score and Plan: 4 or greater and Ondansetron, Dexamethasone and Midazolam  Airway Management Planned: Oral ETT and Video Laryngoscope Planned  Additional Equipment: None  Intra-op Plan:   Post-operative Plan: Extubation in OR  Informed Consent: I have reviewed the patients History and Physical, chart, labs and discussed the procedure including the risks, benefits and alternatives for the proposed anesthesia with the patient or authorized representative who has indicated his/her understanding and acceptance.     Dental advisory given  Plan Discussed with: CRNA  Anesthesia Plan Comments: (PAT note written by Myra Gianotti, PA-C. )     Anesthesia Quick  Evaluation

## 2021-05-24 NOTE — Progress Notes (Signed)
Alyssa Sullivan called to ask about what medications she should take in the am; I told her Zyrtec, if needed Flonase, Zofran. Patient cannot find instruction sheet.

## 2021-05-24 NOTE — Addendum Note (Signed)
Addendum  created 05/24/21 1000 by Jacinta Shoe, PA-C   Clinical Note Signed

## 2021-05-24 NOTE — Addendum Note (Signed)
Addendum  created 05/24/21 1003 by Jacinta Shoe, PA-C   Clinical Note Signed

## 2021-05-25 ENCOUNTER — Inpatient Hospital Stay (HOSPITAL_COMMUNITY): Payer: Self-pay | Admitting: Anesthesiology

## 2021-05-25 ENCOUNTER — Inpatient Hospital Stay (HOSPITAL_COMMUNITY): Payer: Self-pay | Admitting: Vascular Surgery

## 2021-05-25 ENCOUNTER — Encounter (HOSPITAL_COMMUNITY)
Admission: RE | Disposition: A | Discharge status: Home / Self Care | Payer: Self-pay | Source: Home / Self Care | Attending: Obstetrics and Gynecology

## 2021-05-25 ENCOUNTER — Inpatient Hospital Stay (HOSPITAL_COMMUNITY)
Admission: RE | Admit: 2021-05-25 | Discharge: 2021-05-30 | DRG: 742 | Disposition: A | Discharge status: Home / Self Care | Payer: No Typology Code available for payment source | Attending: Obstetrics and Gynecology | Admitting: Obstetrics and Gynecology

## 2021-05-25 ENCOUNTER — Other Ambulatory Visit: Payer: Self-pay

## 2021-05-25 ENCOUNTER — Encounter (HOSPITAL_COMMUNITY): Payer: Self-pay | Admitting: Obstetrics and Gynecology

## 2021-05-25 DIAGNOSIS — D259 Leiomyoma of uterus, unspecified: Principal | ICD-10-CM | POA: Diagnosis present

## 2021-05-25 DIAGNOSIS — Z820 Family history of epilepsy and other diseases of the nervous system: Secondary | ICD-10-CM

## 2021-05-25 DIAGNOSIS — D252 Subserosal leiomyoma of uterus: Secondary | ICD-10-CM

## 2021-05-25 DIAGNOSIS — Z880 Allergy status to penicillin: Secondary | ICD-10-CM

## 2021-05-25 DIAGNOSIS — Z885 Allergy status to narcotic agent status: Secondary | ICD-10-CM

## 2021-05-25 DIAGNOSIS — D251 Intramural leiomyoma of uterus: Secondary | ICD-10-CM

## 2021-05-25 DIAGNOSIS — D250 Submucous leiomyoma of uterus: Secondary | ICD-10-CM | POA: Diagnosis not present

## 2021-05-25 DIAGNOSIS — D269 Other benign neoplasm of uterus, unspecified: Secondary | ICD-10-CM

## 2021-05-25 DIAGNOSIS — K567 Ileus, unspecified: Secondary | ICD-10-CM | POA: Diagnosis not present

## 2021-05-25 DIAGNOSIS — Z833 Family history of diabetes mellitus: Secondary | ICD-10-CM

## 2021-05-25 DIAGNOSIS — Z8249 Family history of ischemic heart disease and other diseases of the circulatory system: Secondary | ICD-10-CM

## 2021-05-25 DIAGNOSIS — F17210 Nicotine dependence, cigarettes, uncomplicated: Secondary | ICD-10-CM | POA: Diagnosis present

## 2021-05-25 DIAGNOSIS — Z888 Allergy status to other drugs, medicaments and biological substances status: Secondary | ICD-10-CM

## 2021-05-25 DIAGNOSIS — Z90710 Acquired absence of both cervix and uterus: Secondary | ICD-10-CM | POA: Diagnosis present

## 2021-05-25 HISTORY — PX: HYSTERECTOMY ABDOMINAL WITH SALPINGECTOMY: SHX6725

## 2021-05-25 LAB — POCT PREGNANCY, URINE: Preg Test, Ur: NEGATIVE

## 2021-05-25 LAB — TYPE AND SCREEN
ABO/RH(D): O POS
Antibody Screen: NEGATIVE

## 2021-05-25 LAB — CREATININE, SERUM
Creatinine, Ser: 0.85 mg/dL (ref 0.44–1.00)
GFR, Estimated: >60 mL/min (ref >60)

## 2021-05-25 LAB — CBC
HCT: 44.6 % (ref 36.0–46.0)
Hemoglobin: 14.4 g/dL (ref 12.0–15.0)
MCH: 31.1 pg (ref 26.0–34.0)
MCHC: 32.3 g/dL (ref 30.0–36.0)
MCV: 96.3 fL (ref 80.0–100.0)
Platelets: 216 10*3/uL (ref 150–400)
RBC: 4.63 MIL/uL (ref 3.87–5.11)
RDW: 13.5 % (ref 11.5–15.5)
WBC: 11.3 10*3/uL — ABNORMAL HIGH (ref 4.0–10.5)
nRBC: 0 % (ref 0.0–0.2)

## 2021-05-25 SURGERY — HYSTERECTOMY, TOTAL, ABDOMINAL, WITH SALPINGECTOMY
Anesthesia: General | Site: Abdomen | Laterality: Bilateral

## 2021-05-25 MED ORDER — ONDANSETRON HCL 4 MG/2ML IJ SOLN
INTRAMUSCULAR | Status: AC
  Filled 2021-05-25: qty 2

## 2021-05-25 MED ORDER — MIDAZOLAM HCL 2 MG/2ML IJ SOLN
INTRAMUSCULAR | Status: AC
  Filled 2021-05-25: qty 2

## 2021-05-25 MED ORDER — DEXAMETHASONE SODIUM PHOSPHATE 10 MG/ML IJ SOLN
INTRAMUSCULAR | Status: DC | PRN
  Administered 2021-05-25: 10 mg via INTRAVENOUS

## 2021-05-25 MED ORDER — KETOROLAC TROMETHAMINE 30 MG/ML IJ SOLN
30.0000 mg | Freq: Four times a day (QID) | INTRAMUSCULAR | Status: AC
  Administered 2021-05-25 – 2021-05-26 (×3): 30 mg via INTRAVENOUS
  Filled 2021-05-25 (×3): qty 1

## 2021-05-25 MED ORDER — LACTATED RINGERS IV SOLN: INTRAVENOUS | Status: DC

## 2021-05-25 MED ORDER — CHLORHEXIDINE GLUCONATE 0.12 % MT SOLN
15.0000 mL | Freq: Once | OROMUCOSAL | Status: AC
  Administered 2021-05-25: 15 mL via OROMUCOSAL
  Filled 2021-05-25: qty 15

## 2021-05-25 MED ORDER — BUPIVACAINE HCL (PF) 0.5 % IJ SOLN
INTRAMUSCULAR | Status: AC
  Filled 2021-05-25: qty 120

## 2021-05-25 MED ORDER — ROCURONIUM BROMIDE 10 MG/ML (PF) SYRINGE
PREFILLED_SYRINGE | INTRAVENOUS | Status: DC | PRN
  Administered 2021-05-25: 70 mg via INTRAVENOUS

## 2021-05-25 MED ORDER — POVIDONE-IODINE 10 % EX SWAB
2.0000 "application " | Freq: Once | CUTANEOUS | Status: AC
  Administered 2021-05-25: 2 via TOPICAL

## 2021-05-25 MED ORDER — ROCURONIUM BROMIDE 10 MG/ML (PF) SYRINGE
PREFILLED_SYRINGE | INTRAVENOUS | Status: AC
  Filled 2021-05-25: qty 10

## 2021-05-25 MED ORDER — SIMETHICONE 80 MG PO CHEW
80.0000 mg | CHEWABLE_TABLET | Freq: Four times a day (QID) | ORAL | Status: DC | PRN
  Administered 2021-05-26: 80 mg via ORAL
  Filled 2021-05-25 (×2): qty 1

## 2021-05-25 MED ORDER — LACTATED RINGERS IV SOLN: INTRAVENOUS | Status: DC | PRN

## 2021-05-25 MED ORDER — PHENYLEPHRINE 40 MCG/ML (10ML) SYRINGE FOR IV PUSH (FOR BLOOD PRESSURE SUPPORT)
PREFILLED_SYRINGE | INTRAVENOUS | Status: DC | PRN
  Administered 2021-05-25 (×4): 40 ug via INTRAVENOUS
  Administered 2021-05-25 (×2): 80 ug via INTRAVENOUS
  Administered 2021-05-25: 40 ug via INTRAVENOUS

## 2021-05-25 MED ORDER — PROPOFOL 10 MG/ML IV BOLUS
INTRAVENOUS | Status: AC
  Filled 2021-05-25: qty 20

## 2021-05-25 MED ORDER — FENTANYL CITRATE (PF) 100 MCG/2ML IJ SOLN
INTRAMUSCULAR | Status: AC
  Filled 2021-05-25: qty 2

## 2021-05-25 MED ORDER — MEPERIDINE HCL 25 MG/ML IJ SOLN: 6.2500 mg | INTRAMUSCULAR | Status: DC | PRN

## 2021-05-25 MED ORDER — ACETAMINOPHEN 500 MG PO TABS
1000.0000 mg | ORAL_TABLET | ORAL | Status: AC
  Administered 2021-05-25: 1000 mg via ORAL
  Filled 2021-05-25: qty 2

## 2021-05-25 MED ORDER — ONDANSETRON HCL 4 MG/2ML IJ SOLN
INTRAMUSCULAR | Status: DC | PRN
  Administered 2021-05-25: 4 mg via INTRAVENOUS

## 2021-05-25 MED ORDER — ACETAMINOPHEN 325 MG PO TABS: 650.0000 mg | ORAL_TABLET | ORAL | Status: DC | PRN

## 2021-05-25 MED ORDER — SOD CITRATE-CITRIC ACID 500-334 MG/5ML PO SOLN
30.0000 mL | ORAL | Status: AC
  Administered 2021-05-25: 30 mL via ORAL
  Filled 2021-05-25: qty 30

## 2021-05-25 MED ORDER — NALOXONE HCL 0.4 MG/ML IJ SOLN: 0.4000 mg | INTRAMUSCULAR | Status: DC | PRN

## 2021-05-25 MED ORDER — ZOLPIDEM TARTRATE 5 MG PO TABS: 5.0000 mg | ORAL_TABLET | Freq: Every evening | ORAL | Status: DC | PRN

## 2021-05-25 MED ORDER — DEXMEDETOMIDINE (PRECEDEX) IN NS 20 MCG/5ML (4 MCG/ML) IV SYRINGE
PREFILLED_SYRINGE | INTRAVENOUS | Status: DC | PRN
  Administered 2021-05-25 (×2): 8 ug via INTRAVENOUS
  Administered 2021-05-25 (×2): 12 ug via INTRAVENOUS

## 2021-05-25 MED ORDER — DIPHENHYDRAMINE HCL 50 MG/ML IJ SOLN: 12.5000 mg | Freq: Four times a day (QID) | INTRAMUSCULAR | Status: DC | PRN

## 2021-05-25 MED ORDER — OXYCODONE HCL 5 MG PO TABS
5.0000 mg | ORAL_TABLET | ORAL | Status: DC | PRN
  Administered 2021-05-26 (×3): 10 mg via ORAL
  Administered 2021-05-26: 5 mg via ORAL
  Administered 2021-05-27 – 2021-05-30 (×10): 10 mg via ORAL
  Filled 2021-05-25 (×8): qty 2
  Filled 2021-05-25 (×2): qty 1
  Filled 2021-05-25 (×5): qty 2

## 2021-05-25 MED ORDER — CLINDAMYCIN PHOSPHATE 900 MG/50ML IV SOLN
900.0000 mg | INTRAVENOUS | Status: AC
  Administered 2021-05-25: 900 mg via INTRAVENOUS
  Filled 2021-05-25: qty 50

## 2021-05-25 MED ORDER — PHENYLEPHRINE 40 MCG/ML (10ML) SYRINGE FOR IV PUSH (FOR BLOOD PRESSURE SUPPORT)
PREFILLED_SYRINGE | INTRAVENOUS | Status: AC
  Filled 2021-05-25: qty 10

## 2021-05-25 MED ORDER — DEXAMETHASONE SODIUM PHOSPHATE 10 MG/ML IJ SOLN
INTRAMUSCULAR | Status: AC
  Filled 2021-05-25: qty 1

## 2021-05-25 MED ORDER — DEXMEDETOMIDINE (PRECEDEX) IN NS 20 MCG/5ML (4 MCG/ML) IV SYRINGE
PREFILLED_SYRINGE | INTRAVENOUS | Status: AC
  Filled 2021-05-25: qty 10

## 2021-05-25 MED ORDER — ONDANSETRON HCL 4 MG PO TABS
4.0000 mg | ORAL_TABLET | Freq: Four times a day (QID) | ORAL | Status: DC | PRN
  Filled 2021-05-25: qty 1

## 2021-05-25 MED ORDER — LIDOCAINE 2% (20 MG/ML) 5 ML SYRINGE
INTRAMUSCULAR | Status: AC
  Filled 2021-05-25: qty 5

## 2021-05-25 MED ORDER — FENTANYL CITRATE (PF) 250 MCG/5ML IJ SOLN
INTRAMUSCULAR | Status: AC
  Filled 2021-05-25: qty 5

## 2021-05-25 MED ORDER — SUGAMMADEX SODIUM 200 MG/2ML IV SOLN
INTRAVENOUS | Status: DC | PRN
  Administered 2021-05-25: 200 mg via INTRAVENOUS

## 2021-05-25 MED ORDER — GENTAMICIN SULFATE 40 MG/ML IJ SOLN
5.0000 mg/kg | Freq: Once | INTRAVENOUS | Status: AC
  Administered 2021-05-25: 460 mg via INTRAVENOUS
  Filled 2021-05-25: qty 11.5

## 2021-05-25 MED ORDER — MORPHINE SULFATE 1 MG/ML IV SOLN PCA
INTRAVENOUS | Status: AC
  Filled 2021-05-25: qty 30

## 2021-05-25 MED ORDER — 0.9 % SODIUM CHLORIDE (POUR BTL) OPTIME
TOPICAL | Status: DC | PRN
  Administered 2021-05-25 (×2): 1000 mL

## 2021-05-25 MED ORDER — DOCUSATE SODIUM 100 MG PO CAPS
100.0000 mg | ORAL_CAPSULE | Freq: Two times a day (BID) | ORAL | Status: DC
  Administered 2021-05-25 – 2021-05-30 (×9): 100 mg via ORAL
  Filled 2021-05-25 (×10): qty 1

## 2021-05-25 MED ORDER — FENTANYL CITRATE (PF) 100 MCG/2ML IJ SOLN
25.0000 ug | INTRAMUSCULAR | Status: DC | PRN
  Administered 2021-05-25 (×3): 50 ug via INTRAVENOUS

## 2021-05-25 MED ORDER — KETOROLAC TROMETHAMINE 30 MG/ML IJ SOLN: 30.0000 mg | Freq: Four times a day (QID) | INTRAMUSCULAR | Status: AC

## 2021-05-25 MED ORDER — ORAL CARE MOUTH RINSE: 15.0000 mL | Freq: Once | OROMUCOSAL | Status: AC

## 2021-05-25 MED ORDER — DEXMEDETOMIDINE HCL IN NACL 200 MCG/50ML IV SOLN
0.4000 ug/kg/h | INTRAVENOUS | Status: DC
  Filled 2021-05-25: qty 50

## 2021-05-25 MED ORDER — SODIUM CHLORIDE 0.9% FLUSH: 9.0000 mL | INTRAVENOUS | Status: DC | PRN

## 2021-05-25 MED ORDER — DIPHENHYDRAMINE HCL 12.5 MG/5ML PO ELIX: 12.5000 mg | ORAL_SOLUTION | Freq: Four times a day (QID) | ORAL | Status: DC | PRN

## 2021-05-25 MED ORDER — ACETAMINOPHEN 10 MG/ML IV SOLN: 1000.0000 mg | Freq: Once | INTRAVENOUS | Status: DC | PRN

## 2021-05-25 MED ORDER — PROPOFOL 10 MG/ML IV BOLUS
INTRAVENOUS | Status: DC | PRN
Start: 2021-05-25 — End: 2021-05-25
  Administered 2021-05-25: 200 mg via INTRAVENOUS

## 2021-05-25 MED ORDER — BISACODYL 5 MG PO TBEC
5.0000 mg | DELAYED_RELEASE_TABLET | Freq: Every day | ORAL | Status: DC | PRN
  Administered 2021-05-28: 5 mg via ORAL
  Filled 2021-05-25: qty 1

## 2021-05-25 MED ORDER — ONDANSETRON HCL 4 MG/2ML IJ SOLN
4.0000 mg | Freq: Four times a day (QID) | INTRAMUSCULAR | Status: DC | PRN
  Administered 2021-05-25 – 2021-05-29 (×6): 4 mg via INTRAVENOUS
  Filled 2021-05-25 (×6): qty 2

## 2021-05-25 MED ORDER — BUPIVACAINE HCL (PF) 0.5 % IJ SOLN
INTRAMUSCULAR | Status: DC | PRN
  Administered 2021-05-25: 18 mL

## 2021-05-25 MED ORDER — FENTANYL CITRATE (PF) 250 MCG/5ML IJ SOLN
INTRAMUSCULAR | Status: DC | PRN
  Administered 2021-05-25: 50 ug via INTRAVENOUS
  Administered 2021-05-25: 200 ug via INTRAVENOUS

## 2021-05-25 MED ORDER — METHOCARBAMOL 500 MG PO TABS
500.0000 mg | ORAL_TABLET | Freq: Three times a day (TID) | ORAL | Status: DC | PRN
  Administered 2021-05-25 – 2021-05-30 (×8): 500 mg via ORAL
  Filled 2021-05-25 (×9): qty 1

## 2021-05-25 MED ORDER — LIDOCAINE 2% (20 MG/ML) 5 ML SYRINGE
INTRAMUSCULAR | Status: DC | PRN
  Administered 2021-05-25: 100 mg via INTRAVENOUS

## 2021-05-25 MED ORDER — HEMOSTATIC AGENTS (NO CHARGE) OPTIME
TOPICAL | Status: DC | PRN
  Administered 2021-05-25: 1 via TOPICAL

## 2021-05-25 MED ORDER — FLUTICASONE PROPIONATE 50 MCG/ACT NA SUSP
1.0000 | Freq: Every day | NASAL | Status: DC
  Administered 2021-05-25 – 2021-05-30 (×6): 1 via NASAL
  Filled 2021-05-25: qty 16

## 2021-05-25 MED ORDER — DEXMEDETOMIDINE HCL IN NACL 400 MCG/100ML IV SOLN
0.4000 ug/kg/h | INTRAVENOUS | Status: AC
  Administered 2021-05-25: .5 ug/kg/h via INTRAVENOUS
  Filled 2021-05-25: qty 100

## 2021-05-25 MED ORDER — PROMETHAZINE HCL 25 MG/ML IJ SOLN: 6.2500 mg | INTRAMUSCULAR | Status: DC | PRN

## 2021-05-25 MED ORDER — MORPHINE SULFATE 1 MG/ML IV SOLN PCA
INTRAVENOUS | Status: DC
  Administered 2021-05-25: 7.5 mg via INTRAVENOUS
  Administered 2021-05-25: 1 mg via INTRAVENOUS
  Administered 2021-05-26: 1.5 mg via INTRAVENOUS

## 2021-05-25 MED ORDER — ONDANSETRON HCL 4 MG/2ML IJ SOLN: 4.0000 mg | Freq: Four times a day (QID) | INTRAMUSCULAR | Status: DC | PRN

## 2021-05-25 MED ORDER — MIDAZOLAM HCL 5 MG/5ML IJ SOLN
INTRAMUSCULAR | Status: DC | PRN
  Administered 2021-05-25 (×2): 2 mg via INTRAVENOUS

## 2021-05-25 MED ORDER — ENOXAPARIN SODIUM 40 MG/0.4ML IJ SOSY
40.0000 mg | PREFILLED_SYRINGE | INTRAMUSCULAR | Status: DC
  Administered 2021-05-26 – 2021-05-30 (×5): 40 mg via SUBCUTANEOUS
  Filled 2021-05-25 (×5): qty 0.4

## 2021-05-25 SURGICAL SUPPLY — 53 items
ADH SKN CLS APL DERMABOND .7 (GAUZE/BANDAGES/DRESSINGS) ×1
BAG COUNTER SPONGE SURGICOUNT (BAG) ×4 IMPLANT
BAG SPNG CNTER NS LX DISP (BAG) ×3
CANISTER SUCT 3000ML PPV (MISCELLANEOUS) ×2 IMPLANT
CNTNR URN SCR LID CUP LEK RST (MISCELLANEOUS) IMPLANT
CONT SPEC 4OZ STRL OR WHT (MISCELLANEOUS) ×4
DERMABOND ADVANCED (GAUZE/BANDAGES/DRESSINGS) ×1
DERMABOND ADVANCED .7 DNX12 (GAUZE/BANDAGES/DRESSINGS) IMPLANT
DRAPE CESAREAN BIRTH W POUCH (DRAPES) ×1 IMPLANT
DRAPE WARM FLUID 44X44 (DRAPES) ×1 IMPLANT
DRSG OPSITE POSTOP 4X10 (GAUZE/BANDAGES/DRESSINGS) ×2 IMPLANT
DURAPREP 26ML APPLICATOR (WOUND CARE) ×2 IMPLANT
GAUZE 4X4 16PLY ~~LOC~~+RFID DBL (SPONGE) ×3 IMPLANT
GLOVE SURG ENC MOIS LTX SZ6.5 (GLOVE) ×1 IMPLANT
GLOVE SURG ENC MOIS LTX SZ7.5 (GLOVE) ×1 IMPLANT
GLOVE SURG ORTHO LTX SZ8 (GLOVE) ×1 IMPLANT
GLOVE SURG POLYISO LF SZ7 (GLOVE) ×1 IMPLANT
GLOVE SURG UNDER POLY LF SZ7 (GLOVE) ×5 IMPLANT
GLOVE SURG UNDER POLY LF SZ7.5 (GLOVE) ×1 IMPLANT
GOWN STRL REUS W/ TWL LRG LVL3 (GOWN DISPOSABLE) ×3 IMPLANT
GOWN STRL REUS W/ TWL XL LVL3 (GOWN DISPOSABLE) IMPLANT
GOWN STRL REUS W/TWL 2XL LVL3 (GOWN DISPOSABLE) ×1 IMPLANT
GOWN STRL REUS W/TWL LRG LVL3 (GOWN DISPOSABLE) ×4
GOWN STRL REUS W/TWL XL LVL3 (GOWN DISPOSABLE) ×4
HEMOSTAT ARISTA ABSORB 3G PWDR (HEMOSTASIS) ×1 IMPLANT
HIBICLENS CHG 4% 4OZ BTL (MISCELLANEOUS) ×2 IMPLANT
KIT TURNOVER KIT B (KITS) ×2 IMPLANT
MANIFOLD NEPTUNE II (INSTRUMENTS) ×1 IMPLANT
NEEDLE HYPO 22GX1.5 SAFETY (NEEDLE) ×2 IMPLANT
NS IRRIG 1000ML POUR BTL (IV SOLUTION) ×3 IMPLANT
PACK ABDOMINAL GYN (CUSTOM PROCEDURE TRAY) ×2 IMPLANT
PAD ARMBOARD 7.5X6 YLW CONV (MISCELLANEOUS) ×2 IMPLANT
PAD OB MATERNITY 4.3X12.25 (PERSONAL CARE ITEMS) ×2 IMPLANT
PENCIL SMOKE EVAC W/HOLSTER (ELECTROSURGICAL) ×2 IMPLANT
SPONGE T-LAP 18X18 ~~LOC~~+RFID (SPONGE) ×3 IMPLANT
STRIP CLOSURE SKIN 1/2X4 (GAUZE/BANDAGES/DRESSINGS) ×1 IMPLANT
SUT PDS AB 1 TP1 96 (SUTURE) ×2 IMPLANT
SUT PLAIN 2 0 XLH (SUTURE) ×2 IMPLANT
SUT VIC AB 0 CT1 18XCR BRD8 (SUTURE) ×4 IMPLANT
SUT VIC AB 0 CT1 27 (SUTURE) ×4
SUT VIC AB 0 CT1 27XBRD ANBCTR (SUTURE) ×1 IMPLANT
SUT VIC AB 0 CT1 27XCR 8 STRN (SUTURE) IMPLANT
SUT VIC AB 0 CT1 36 (SUTURE) ×4 IMPLANT
SUT VIC AB 0 CT1 8-18 (SUTURE) ×4
SUT VIC AB 2-0 CT1 27 (SUTURE) ×2
SUT VIC AB 2-0 CT1 TAPERPNT 27 (SUTURE) ×1 IMPLANT
SUT VIC AB 4-0 PS2 27 (SUTURE) ×2 IMPLANT
SUT VICRYL 0 TIES 12 18 (SUTURE) ×2 IMPLANT
SUT VICRYL 4-0 PS2 18IN ABS (SUTURE) ×1 IMPLANT
SWABSTK COMLB BENZOIN TINCTURE (MISCELLANEOUS) ×1 IMPLANT
SYR CONTROL 10ML LL (SYRINGE) ×2 IMPLANT
TOWEL GREEN STERILE FF (TOWEL DISPOSABLE) ×3 IMPLANT
TRAY FOLEY W/BAG SLVR 14FR (SET/KITS/TRAYS/PACK) ×2 IMPLANT

## 2021-05-25 NOTE — Progress Notes (Signed)
Mobility Specialist Progress Note    05/25/21 1650  Mobility  Activity Ambulated in hall  Level of Assistance Minimal assist, patient does 75% or more  Assistive Device  (HHA)  Distance Ambulated (ft) 60 ft  Mobility Ambulated with assistance in hallway  Mobility Response Tolerated fair  Mobility performed by Mobility specialist  $Mobility charge 1 Mobility   Pt received in bed and agreeable. C/o stomach pain. Returned to bed with call bell in reach and NT present.   Hildred Alamin Mobility Specialist  Mobility Specialist Phone: [phone]

## 2021-05-25 NOTE — Addendum Note (Signed)
Addendum  created 05/25/21 1206 by Jenne Campus, CRNA   Clinical Note Signed, Intraprocedure Blocks edited, Intraprocedure Event edited, SmartForm saved

## 2021-05-25 NOTE — Op Note (Signed)
Tylynn Holz PROCEDURE DATE: 05/25/2021  PREOPERATIVE DIAGNOSES:  Symptomatic fibroids, abdominal pain, mass effect POSTOPERATIVE DIAGNOSES:  The same SURGEON:  Lynnda Shields, MD ASSISTANT:  Dr. Arlina Robes.  An experienced assistant was required given the standard of surgical care given the complexity of the case.  This assistant was needed for exposure, dissection, suctioning, retraction, instrument exchange, and for overall help during the procedure. OPERATION:  Total abdominal hysterectomy, Bilateral Salpingectomy ANESTHESIA:  General endotracheal.  INDICATIONS: The patient is a 40 y.o. G0 with the aforementioned diagnoses who desires definitive surgical management. On the preoperative visit, the risks, benefits, indications, and alternatives of the procedure were reviewed with the patient.  On the day of surgery, the risks of surgery were again discussed with the patient including but not limited to: bleeding which may require transfusion or reoperation; infection which may require antibiotics; injury to bowel, bladder, ureters or other surrounding organs; need for additional procedures; thromboembolic phenomenon, incisional problems and other postoperative/anesthesia complications. Written informed consent was obtained.    OPERATIVE FINDINGS: A 42 week size uterus with normal tubes and ovaries bilaterally.  ESTIMATED BLOOD LOSS: 150 ml FLUIDS:  1500 ml of Lactated Ringers URINE OUTPUT:  350 ml of clear yellow urine. SPECIMENS:  Uterus,cervix,  bilateral fallopian tubes sent to pathology COMPLICATIONS:  None immediate.   DESCRIPTION OF PROCEDURE:  The patient received intravenous antibiotics and had sequential compression devices applied to her lower extremities while in the preoperative area.   She was taken to the operating room and placed under general anesthesia without difficulty.The abdomen and perineum were prepped and draped in a sterile manner, and she was placed in a  dorsal supine position.  A Foley catheter was inserted into the bladder and attached to constant drainage. After an adequate timeout was performed, a midline skin incision was made. This incision was taken to and then 3-4 cm above the umbilicus.  This incision was taken down to the fascia using electrocautery with care given to maintain good hemostasis. The fascia was incised vertically in the midline and the fascial incision was then extended superiorly and inferiorly using electrocautery without difficulty.  The rectus muscles were split bluntly in the midline and the peritoneum entered sharply without complication. This peritoneal incision was then extended superiorly and inferiorly with care given to prevent bowel or bladder injury. Attention was then turned to the pelvis. A retractor was placed into the incision, and the bowel was packed away with moist laparotomy sponges. The uterus was extremely enlarged with several large fibroids on top of the uterus proper.  The uterus at this point was noted to be mobilized and was delivered up out of the abdomen.  The round ligaments on each side were clamped, suture ligated with 0 Vicryl, and transected with electrocautery allowing entry into the broad ligament. Of note, all sutures used in this procedure are 0 Vicryl unless otherwise noted. The anterior and posterior leaves of the broad ligament were separated, and the ureters were inspected to be safely away from the area of dissection bilaterally.  Adnexae were clamped on the patient's right side, cut, and doubly suture ligated. This procedure was repeated in an identical fashion on the left site allowing for both adnexa to remain in place.  Kelly clamps were placed on the mesosalpinx of the right fallopian tube, and the fallopian tube was excised.  The pedicle was then secured with a free tie.  A similar process was carried out on the left side, allowing for bilateral  salpingectomy.     A bladder flap was then  created.  The bladder was then bluntly dissected off the lower uterine segment and cervix with good hemostasis noted. The uterine arteries were then skeletonized bilaterally and then clamped, cut, and doubly suture ligated with care given to prevent ureteral injury.  The uterus was then amputated across the lower uterine segment leaving the cervix intact due to the large size.  The uterosacral ligaments were then clamped, cut, and ligated bilaterally.  Finally, the cardinal ligaments were clamped, cut, and ligated bilaterally.  Acutely curved clamps were placed across the vagina just under the cervix, and the specimen was amputated and sent to pathology. The vaginal cuff angles were closed with Heaney stiches with care given to incorporate the uterosacral-cardinal ligament pedicles on both sides. The middle of the vaginal cuff was closed with a series of interrupted figure-of-eight sutures with care given to incorporate the anterior pubocervical fascia and the posterior rectovaginal fascia.   The pelvis was irrigated and hemostasis was reconfirmed at all pedicles and along the pelvic sidewall.  Arista was placed over the vaginal cuff due to some slight oozing.  Good hemostasis was noted.   The ureters were inspected and noted to be peristalsing bilaterally.  All laparotomy sponges and instruments were removed from the abdomen. The peritoneum was closed with a running stitch of 2-0 vicryl, and the fascia was also closed in a running fashion with #1 looped PDS. The subcutaneous layer was reapproximated with 2-0 plain gut. The skin was closed with a 4-0 Vicryl subcuticular stitch along with dermabond .  Sponge, lap, needle, and instrument counts were correct times two. The patient was taken to the recovery area awake, extubated and in stable condition.   Lynnda Shields, MD, [place] [facility], [place]

## 2021-05-25 NOTE — Progress Notes (Signed)
Gynecology Progress Note  Admission Date: 05/25/2021 Current Date: 05/25/2021 6:41 PM  Alyssa Sullivan is a 40 y.o. G0. HD#0 admitted for abdominal hysterectomy secondary to large fibroid uterus.     History complicated by: Patient Active Problem List   Diagnosis Date Noted   Fibroid uterus 05/25/2021   S/P hysterectomy 05/25/2021   Menorrhagia 02/03/2021   Diverticulosis 01/07/2021   Uterine fibroid 01/07/2021   Abdominal pain 01/07/2021   Screening examination for STD (sexually transmitted disease) 01/07/2021   Chronic hip pain 02/21/2013   Low back pain with sciatica 02/21/2013    ROS and patient/family/surgical history, located on admission H&P note dated 05/25/2021, have been reviewed, and there are no changes except as noted below Yesterday/Overnight Events:  N/a  Subjective:  She is awake with good pain control.  Pt has walked x 1.  Diet currently at clears.  Objective:   Vitals:   05/25/21 1307 05/25/21 1324 05/25/21 1634 05/25/21 1719  BP: 128/88 (!) 131/93  (!) 150/93  Pulse: 80 82  83  Resp: (!) 21 16 10 18   Temp: 97.7 F (36.5 C) 98.2 F (36.8 C)  (!) 97.4 F (36.3 C)  TempSrc:  Oral  Oral  SpO2: 100% 100% 100% 100%  Weight:      Height:        Temp:  [97.4 F (36.3 C)-98.5 F (36.9 C)] 97.4 F (36.3 C) (10/25 1719) Pulse Rate:  [66-83] 83 (10/25 1719) Resp:  [10-23] 18 (10/25 1719) BP: (95-150)/(73-103) 150/93 (10/25 1719) SpO2:  [96 %-100 %] 100 % (10/25 1719) Weight:  [90.7 kg] 90.7 kg (10/25 0556) No intake/output data recorded. Total I/O In: 2107.5 [I.V.:1946; IV Piggyback:161.5] Out: 1100 [Urine:950; Blood:150]  Intake/Output Summary (Last 24 hours) at 05/25/2021 1841 Last data filed at 05/25/2021 1722 Gross per 24 hour  Intake 2107.47 ml  Output 1100 ml  Net 1007.47 ml     Current Vital Signs 24h Vital Sign Ranges  T (!) 97.4 F (36.3 C)  Temp  Avg: 97.9 F (36.6 C)  Min: 97.4 F (36.3 C)  Max: 98.5 F (36.9 C)  BP (!)  150/93  BP  Min: 95/79  Max: 150/93  HR 83 Pulse  Avg: 73.5  Min: 66  Max: 83  RR 18 Resp  Avg: 17.6  Min: 10  Max: 23  SaO2 100 % Room Air SpO2  Avg: 98.6 %  Min: 96 %  Max: 100 %       24 Hour I/O Current Shift I/O  Time Ins Outs No intake/output data recorded. 10/25 0701 - 10/25 1900 In: 2107.5 [I.V.:1946] Out: 1100 [Urine:950]   Patient Vitals for the past 12 hrs:  BP Temp Temp src Pulse Resp SpO2  05/25/21 1719 (!) 150/93 (!) 97.4 F (36.3 C) Oral 83 18 100 %  05/25/21 1634 -- -- -- -- 10 100 %  05/25/21 1324 (!) 131/93 98.2 F (36.8 C) Oral 82 16 100 %  05/25/21 1307 128/88 97.7 F (36.5 C) -- 80 (!) 21 100 %  05/25/21 1252 118/88 -- -- 70 18 100 %  05/25/21 1248 -- -- -- -- 19 100 %  05/25/21 1237 95/79 -- -- 82 (!) 22 99 %  05/25/21 1222 115/78 -- -- 70 19 98 %  05/25/21 1207 109/74 -- -- 67 17 97 %  05/25/21 1152 110/75 -- -- 69 16 96 %  05/25/21 1137 107/80 -- -- 66 18 98 %  05/25/21 1122 107/76 -- -- 66 17  97 %  05/25/21 1107 105/74 -- -- 71 15 97 %  05/25/21 1052 113/75 -- -- 71 13 98 %  05/25/21 1037 115/73 -- -- 69 20 100 %  05/25/21 1022 115/74 98.5 F (36.9 C) -- 74 (!) 23 98 %     Patient Vitals for the past 24 hrs:  BP Temp Temp src Pulse Resp SpO2 Height Weight  05/25/21 1719 (!) 150/93 (!) 97.4 F (36.3 C) Oral 83 18 100 % -- --  05/25/21 1634 -- -- -- -- 10 100 % -- --  05/25/21 1324 (!) 131/93 98.2 F (36.8 C) Oral 82 16 100 % -- --  05/25/21 1307 128/88 97.7 F (36.5 C) -- 80 (!) 21 100 % -- --  05/25/21 1252 118/88 -- -- 70 18 100 % -- --  05/25/21 1248 -- -- -- -- 19 100 % -- --  05/25/21 1237 95/79 -- -- 82 (!) 22 99 % -- --  05/25/21 1222 115/78 -- -- 70 19 98 % -- --  05/25/21 1207 109/74 -- -- 67 17 97 % -- --  05/25/21 1152 110/75 -- -- 69 16 96 % -- --  05/25/21 1137 107/80 -- -- 66 18 98 % -- --  05/25/21 1122 107/76 -- -- 66 17 97 % -- --  05/25/21 1107 105/74 -- -- 71 15 97 % -- --  05/25/21 1052 113/75 -- -- 71 13 98 % -- --   05/25/21 1037 115/73 -- -- 69 20 100 % -- --  05/25/21 1022 115/74 98.5 F (36.9 C) -- 74 (!) 23 98 % -- --  05/25/21 0556 (!) 149/103 97.6 F (36.4 C) Oral 82 18 98 % 5\' 8"  (1.727 m) 90.7 kg    Physical exam: General appearance: alert, cooperative, appears stated age, and no distress Abdomen:  soft appropriately tender, ND, absent bowel sounds GU: No gross VB Lungs: clear to auscultation bilaterally Heart: regular rate and rhythm Extremities: no lower extremity edema Skin: midline incision , inferior portion saturated, honeycomb dressing exchanged Psych: appropriate Neurologic: Grossly normal  Medications Current Facility-Administered Medications  Medication Dose Route Frequency Provider Last Rate Last Admin   acetaminophen (TYLENOL) tablet 650 mg  650 mg Oral Q4H PRN Griffin Basil, MD       bisacodyl (DULCOLAX) EC tablet 5 mg  5 mg Oral Daily PRN Griffin Basil, MD       diphenhydrAMINE (BENADRYL) injection 12.5 mg  12.5 mg Intravenous Q6H PRN Griffin Basil, MD       Or   diphenhydrAMINE (BENADRYL) 12.5 MG/5ML elixir 12.5 mg  12.5 mg Oral Q6H PRN Griffin Basil, MD       docusate sodium (COLACE) capsule 100 mg  100 mg Oral BID Griffin Basil, MD   100 mg at 05/25/21 1807   [START ON 05/26/2021] enoxaparin (LOVENOX) injection 40 mg  40 mg Subcutaneous Q24H Griffin Basil, MD       fentaNYL (SUBLIMAZE) 100 MCG/2ML injection            fentaNYL (SUBLIMAZE) 100 MCG/2ML injection            fluticasone (FLONASE) 50 MCG/ACT nasal spray 1 spray  1 spray Each Nare Daily Constant, Peggy, MD       ketorolac (TORADOL) 30 MG/ML injection 30 mg  30 mg Intravenous Q6H Griffin Basil, MD   30 mg at 05/25/21 1808   Or   ketorolac (TORADOL) 30 MG/ML injection 30 mg  30 mg Intramuscular Q6H Griffin Basil, MD       lactated ringers infusion   Intravenous Continuous Griffin Basil, MD 125 mL/hr at 05/25/21 1508 New Bag at 05/25/21 1508   methocarbamol (ROBAXIN) tablet 500 mg   500 mg Oral Q8H PRN Griffin Basil, MD       morphine 1 mg/mL PCA injection   Intravenous Q4H Griffin Basil, MD   1 mg at 05/25/21 1248   morphine 1 mg/mL PCA injection            naloxone (NARCAN) injection 0.4 mg  0.4 mg Intravenous PRN Griffin Basil, MD       And   sodium chloride flush (NS) 0.9 % injection 9 mL  9 mL Intravenous PRN Griffin Basil, MD       ondansetron [facility]) tablet 4 mg  4 mg Oral Q6H PRN Griffin Basil, MD       Or   ondansetron [facility]) injection 4 mg  4 mg Intravenous Q6H PRN Griffin Basil, MD   4 mg at 05/25/21 1334   oxyCODONE (Oxy IR/ROXICODONE) immediate release tablet 5-10 mg  5-10 mg Oral Q4H PRN Griffin Basil, MD       simethicone [facility]) chewable tablet 80 mg  80 mg Oral QID PRN Griffin Basil, MD       zolpidem (AMBIEN) tablet 5 mg  5 mg Oral QHS PRN Griffin Basil, MD          Labs  Recent Labs  Lab 05/25/21 1411  WBC 11.3*  HGB 14.4  HCT 44.6  PLT 216    Recent Labs  Lab 05/25/21 1411  CREATININE 0.85     Assessment & Plan:  POD o s/p TAH with bilateral salpingectomy Appropriate postop appearance Reassess bowel function in AM  Code Status: Full Code   Lynnda Shields, MD Attending Center for [facility])

## 2021-05-25 NOTE — Transfer of Care (Signed)
Immediate Anesthesia Transfer of Care Note  Patient: Alyssa Sullivan  Procedure(s) Performed: ABDOMINAL HYSTERECTOMY WITH BILATERAL SALPINGECTOMY (Bilateral: Abdomen)  Patient Location: PACU  Anesthesia Type:General  Level of Consciousness: oriented, sedated and patient cooperative  Airway & Oxygen Therapy: Patient Spontanous Breathing and Patient connected to nasal cannula oxygen  Post-op Assessment: Report given to RN and Post -op Vital signs reviewed and stable  Post vital signs: Reviewed  Last Vitals:  Vitals Value Taken Time  BP 115/74 05/25/21 1021  Temp 36.9 C 05/25/21 1022  Pulse 76 05/25/21 1026  Resp 15 05/25/21 1026  SpO2 99 % 05/25/21 1026  Vitals shown include unvalidated device data.  Last Pain:  Vitals:   05/25/21 0643  TempSrc:   PainSc: 8          Complications: No notable events documented.

## 2021-05-25 NOTE — Anesthesia Procedure Notes (Addendum)
Procedure Name: Intubation Date/Time: 05/25/2021 7:54 AM Performed by: Jenne Campus, CRNA Pre-anesthesia Checklist: Patient identified, Emergency Drugs available, Suction available and Patient being monitored Patient Re-evaluated:Patient Re-evaluated prior to induction Oxygen Delivery Method: Circle System Utilized Preoxygenation: Pre-oxygenation with 100% oxygen Induction Type: IV induction Ventilation: Mask ventilation without difficulty Laryngoscope Size: Glidescope and 3 Grade View: Grade I Tube type: Oral Tube size: 7.0 mm Number of attempts: 1 Airway Equipment and Method: Stylet, Oral airway and Video-laryngoscopy Placement Confirmation: ETT inserted through vocal cords under direct vision, positive ETCO2 and breath sounds checked- equal and bilateral Secured at: 21 cm Tube secured with: Tape Dental Injury: Teeth and Oropharynx as per pre-operative assessment  Comments: Elective video-glide. Patient has "zinging sensation" when extending her head back and states she has been told she has a slipped disk in her neck. Plan for video-glide. Head remained in a neutral position during induction and intubation.
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
## 2021-05-25 NOTE — Anesthesia Postprocedure Evaluation (Signed)
Anesthesia Post Note  Patient: Alyssa Sullivan  Procedure(s) Performed: ABDOMINAL HYSTERECTOMY WITH BILATERAL SALPINGECTOMY (Bilateral: Abdomen)     Patient location during evaluation: PACU Anesthesia Type: General Level of consciousness: sedated Pain management: pain level controlled Vital Signs Assessment: post-procedure vital signs reviewed and stable Respiratory status: spontaneous breathing Cardiovascular status: stable Postop Assessment: no apparent nausea or vomiting Anesthetic complications: no   No notable events documented.  Last Vitals:  Vitals:   05/25/21 1107 05/25/21 1122  BP: 105/74 107/76  Pulse: 71 66  Resp: 15 17  Temp:    SpO2: 97% 97%    Last Pain:  Vitals:   05/25/21 1122  TempSrc:   PainSc: [place]

## 2021-05-26 ENCOUNTER — Encounter (HOSPITAL_COMMUNITY): Payer: Self-pay | Admitting: Obstetrics and Gynecology

## 2021-05-26 LAB — CBC
HCT: 37.8 % (ref 36.0–46.0)
Hemoglobin: 12.7 g/dL (ref 12.0–15.0)
MCH: 31.7 pg (ref 26.0–34.0)
MCHC: 33.6 g/dL (ref 30.0–36.0)
MCV: 94.3 fL (ref 80.0–100.0)
Platelets: 229 10*3/uL (ref 150–400)
RBC: 4.01 MIL/uL (ref 3.87–5.11)
RDW: 13.4 % (ref 11.5–15.5)
WBC: 10.7 10*3/uL — ABNORMAL HIGH (ref 4.0–10.5)
nRBC: 0 % (ref 0.0–0.2)

## 2021-05-26 LAB — BASIC METABOLIC PANEL
Anion gap: 6 (ref 5–15)
BUN: 9 mg/dL (ref 6–20)
CO2: 27 mmol/L (ref 22–32)
Calcium: 8.3 mg/dL — ABNORMAL LOW (ref 8.9–10.3)
Chloride: 102 mmol/L (ref 98–111)
Creatinine, Ser: 0.77 mg/dL (ref 0.44–1.00)
GFR, Estimated: >60 mL/min (ref >60)
Glucose, Bld: 126 mg/dL — ABNORMAL HIGH (ref 70–99)
Potassium: 3.8 mmol/L (ref 3.5–5.1)
Sodium: 135 mmol/L (ref 135–145)

## 2021-05-26 MED ORDER — HYDROCHLOROTHIAZIDE 12.5 MG PO TABS
12.5000 mg | ORAL_TABLET | Freq: Every day | ORAL | Status: DC
  Administered 2021-05-26 – 2021-05-30 (×5): 12.5 mg via ORAL
  Filled 2021-05-26 (×6): qty 1

## 2021-05-26 MED ORDER — LOSARTAN POTASSIUM 25 MG PO TABS
25.0000 mg | ORAL_TABLET | Freq: Every day | ORAL | Status: DC
  Administered 2021-05-26 – 2021-05-30 (×5): 25 mg via ORAL
  Filled 2021-05-26 (×5): qty 1

## 2021-05-26 MED ORDER — IBUPROFEN 600 MG PO TABS: 600.0000 mg | ORAL_TABLET | Freq: Four times a day (QID) | ORAL | Status: DC | PRN

## 2021-05-26 NOTE — Progress Notes (Signed)
Gynecology Progress Note  Admission Date: 05/25/2021 Current Date: 05/26/2021 1:38 PM Pt seen 0755 Alyssa Sullivan is a 40 y.o. G0 HD#1 admitted for hysterectomy   History complicated by: Patient Active Problem List   Diagnosis Date Noted   Fibroid uterus 05/25/2021   S/P hysterectomy 05/25/2021   Menorrhagia 02/03/2021   Diverticulosis 01/07/2021   Uterine fibroid 01/07/2021   Abdominal pain 01/07/2021   Screening examination for STD (sexually transmitted disease) 01/07/2021   Chronic hip pain 02/21/2013   Low back pain with sciatica 02/21/2013    ROS and patient/family/surgical history, located on admission H&P note dated 05/25/2021, have been reviewed, and there are no changes except as noted below Yesterday/Overnight Events:  No issues  Subjective:  Pt seen.  She notes a small amount of flatus.  Incisional pain noted.  No nausea or vomiting.  She has tolerated some soup.  Foley has been removed.  Objective:   Vitals:   05/26/21 0527 05/26/21 0625 05/26/21 0845 05/26/21 0847  BP: 122/81   (!) 133/93  Pulse: 80   88  Resp: 18 16 10 18   Temp: 98.5 F (36.9 C)   98.1 F (36.7 C)  TempSrc: Oral   Oral  SpO2: 99% 98% 98% 100%  Weight:      Height:        Temp:  [97.4 F (36.3 C)-98.5 F (36.9 C)] 98.1 F (36.7 C) (10/26 0847) Pulse Rate:  [80-88] 88 (10/26 0847) Resp:  [10-18] 18 (10/26 0847) BP: (122-150)/(81-94) 133/93 (10/26 0847) SpO2:  [98 %-100 %] 100 % (10/26 0847) I/O last 3 completed shifts: In: 3339.4 [I.V.:3177.9; IV Piggyback:161.5] Out: 1450 [Urine:1300; Blood:150] Total I/O In: 938.6 [I.V.:938.6] Out: -   Intake/Output Summary (Last 24 hours) at 05/26/2021 1338 Last data filed at 05/26/2021 1149 Gross per 24 hour  Intake 2616.48 ml  Output 950 ml  Net 1666.48 ml     Current Vital Signs 24h Vital Sign Ranges  T 98.1 F (36.7 C) Temp  Avg: 98.1 F (36.7 C)  Min: 97.4 F (36.3 C)  Max: 98.5 F (36.9 C)  BP (!) 133/93  BP  Min:  122/81  Max: 150/93  HR 88 Pulse  Avg: 84.5  Min: 80  Max: 88  RR 18 Resp  Avg: 16  Min: 10  Max: 18  SaO2 100 % Room Air SpO2  Avg: 99 %  Min: 98 %  Max: 100 %       24 Hour I/O Current Shift I/O  Time Ins Outs 10/25 0701 - 10/26 0700 In: 3339.4 [I.V.:3177.9] Out: 8250 [Urine:1300] 10/26 0701 - 10/26 1900 In: 938.6 [I.V.:938.6] Out: -    Patient Vitals for the past 12 hrs:  BP Temp Temp src Pulse Resp SpO2  05/26/21 0847 (!) 133/93 98.1 F (36.7 C) Oral 88 18 100 %  05/26/21 0845 -- -- -- -- 10 98 %  05/26/21 0625 -- -- -- -- 16 98 %  05/26/21 0527 122/81 98.5 F (36.9 C) Oral 80 18 99 %     Patient Vitals for the past 24 hrs:  BP Temp Temp src Pulse Resp SpO2  05/26/21 0847 (!) 133/93 98.1 F (36.7 C) Oral 88 18 100 %  05/26/21 0845 -- -- -- -- 10 98 %  05/26/21 0625 -- -- -- -- 16 98 %  05/26/21 0527 122/81 98.5 F (36.9 C) Oral 80 18 99 %  05/26/21 0045 -- -- -- -- 18 98 %  05/25/21 2116 -- -- -- --  18 99 %  05/25/21 2021 (!) 148/94 98.2 F (36.8 C) Oral 87 18 99 %  05/25/21 1719 (!) 150/93 (!) 97.4 F (36.3 C) Oral 83 18 100 %  05/25/21 1634 -- -- -- -- 10 100 %    Physical exam: General appearance: alert, cooperative, appears stated age, and no distress Abdomen:  soft, nondistended, nontender, hypoactive bowel sounds GU: No gross VB Lungs: clear to auscultation bilaterally Heart: regular rate and rhythm Extremities: no lower extremity edema, no calf pain bilaterally Skin: incision c/d/I, bandage was exchanged last evening, no drainage noted Psych: appropriate Neurologic: Grossly normal  Medications Current Facility-Administered Medications  Medication Dose Route Frequency Provider Last Rate Last Admin   acetaminophen (TYLENOL) tablet 650 mg  650 mg Oral Q4H PRN Griffin Basil, MD       bisacodyl (DULCOLAX) EC tablet 5 mg  5 mg Oral Daily PRN Griffin Basil, MD       docusate sodium (COLACE) capsule 100 mg  100 mg Oral BID Griffin Basil, MD   100  mg at 05/26/21 1112   enoxaparin (LOVENOX) injection 40 mg  40 mg Subcutaneous Q24H Griffin Basil, MD   40 mg at 05/26/21 0844   fluticasone (FLONASE) 50 MCG/ACT nasal spray 1 spray  1 spray Each Nare Daily Constant, Peggy, MD   1 spray at 05/26/21 1122   ibuprofen (ADVIL) tablet 600 mg  600 mg Oral Q6H PRN Griffin Basil, MD       lactated ringers infusion   Intravenous Continuous Griffin Basil, MD   Stopped at 05/26/21 1149   methocarbamol (ROBAXIN) tablet 500 mg  500 mg Oral Q8H PRN Griffin Basil, MD   500 mg at 05/26/21 0707   ondansetron (ZOFRAN) tablet 4 mg  4 mg Oral Q6H PRN Griffin Basil, MD       Or   ondansetron [facility]) injection 4 mg  4 mg Intravenous Q6H PRN Griffin Basil, MD   4 mg at 05/25/21 1334   oxyCODONE (Oxy IR/ROXICODONE) immediate release tablet 5-10 mg  5-10 mg Oral Q4H PRN Griffin Basil, MD   10 mg at 05/26/21 1251   simethicone (MYLICON) chewable tablet 80 mg  80 mg Oral QID PRN Griffin Basil, MD   80 mg at 05/26/21 1111   zolpidem (AMBIEN) tablet 5 mg  5 mg Oral QHS PRN Griffin Basil, MD          Labs  Recent Labs  Lab 05/25/21 1411 05/26/21 0137  WBC 11.3* 10.7*  HGB 14.4 12.7  HCT 44.6 37.8  PLT 216 229    Recent Labs  Lab 05/25/21 1411 05/26/21 0137  NA  --  135  K  --  3.8  CL  --  102  CO2  --  27  BUN  --  9  CREATININE 0.85 0.77  CALCIUM  --  8.3*  GLUCOSE  --  126*    Radiology N/a  Assessment & Plan:  POD 1 s/p TAH with bilateral salpingectomy D/C PCA Advance diet as tolerated Foley removed Ambulate Awaiting return of bowel function Liberal use of abdominal binder  Code Status: Full Code   Lynnda Shields MD Attending Center for [place] [employer])

## 2021-05-26 NOTE — Progress Notes (Signed)
Patient is complaining of excruciating, stabbing abdominal pain that comes and goes with movement and while lying still. I contacted Dr Elgie Congo via secure chat. Patient is not distended and has not passed gas recently. I've applied a K-pad to try and help with pain. Patient has voided since her foley was removed. Dr bass is planning to come by this evening. I will continue to make the patient as comfortable as possible. Patient is appreciative of my efforts.

## 2021-05-26 NOTE — Progress Notes (Signed)
Mobility Specialist Progress Note:   05/26/21 1000  Mobility  Activity Ambulated in hall  Level of Assistance Modified independent, requires aide device or extra time  Assistive Device Front wheel walker  Distance Ambulated (ft) 400 ft  Mobility Ambulated independently in hallway  Mobility Response Tolerated well  Mobility performed by Mobility specialist  $Mobility charge 1 Mobility   Pt c/o 6/10 abdominal pain, RN notified. Use of RW d/t pain this am.   Nelta Numbers Mobility Specialist  Phone [phone]

## 2021-05-27 ENCOUNTER — Encounter (HOSPITAL_COMMUNITY): Payer: Self-pay | Admitting: Obstetrics and Gynecology

## 2021-05-27 ENCOUNTER — Inpatient Hospital Stay (HOSPITAL_COMMUNITY): Payer: Self-pay

## 2021-05-27 LAB — CBC
HCT: 35.8 % — ABNORMAL LOW (ref 36.0–46.0)
Hemoglobin: 12 g/dL (ref 12.0–15.0)
MCH: 31.6 pg (ref 26.0–34.0)
MCHC: 33.5 g/dL (ref 30.0–36.0)
MCV: 94.2 fL (ref 80.0–100.0)
Platelets: 235 10*3/uL (ref 150–400)
RBC: 3.8 MIL/uL — ABNORMAL LOW (ref 3.87–5.11)
RDW: 13 % (ref 11.5–15.5)
WBC: 9.2 10*3/uL (ref 4.0–10.5)
nRBC: 0 % (ref 0.0–0.2)

## 2021-05-27 MED ORDER — IOHEXOL 9 MG/ML PO SOLN
500.0000 mL | ORAL | Status: AC
  Administered 2021-05-27: 500 mL via ORAL

## 2021-05-27 MED ORDER — IOHEXOL 350 MG/ML SOLN
80.0000 mL | Freq: Once | INTRAVENOUS | Status: AC | PRN
  Administered 2021-05-27: 80 mL via INTRAVENOUS

## 2021-05-27 MED ORDER — IOHEXOL 300 MG/ML  SOLN
100.0000 mL | Freq: Once | INTRAMUSCULAR | Status: AC
  Administered 2021-05-27: 100 mL via INTRAVENOUS

## 2021-05-27 MED ORDER — IOHEXOL 9 MG/ML PO SOLN
ORAL | Status: AC
  Administered 2021-05-27: 500 mL
  Filled 2021-05-27: qty 1000

## 2021-05-27 NOTE — Evaluation (Signed)
Physical Therapy Evaluation Patient Details Name: Alyssa Sullivan MRN: [id number] DOB: 09-May-1981 Today's Date: 05/27/2021  History of Present Illness  Patient is a 40 y/o female admitted with fibroid tumors with mass effect and discomfort now s/p TAH/BSO on 05/25/21.  PMH positive for GERD, anxiety, HTN, hypothyroidism, asthma, neuropathy and planter fasciitis.  Clinical Impression  Patient presents with decreased mobility due to abdominal pain, general weakness and she will benefit from skilled PT in the acute setting and likely not to need follow up PT at d/c.  Living with parents who can assist PRN.  She will have to negotiate steps to bedroom so will continue to progress and practice as able.        Recommendations for follow up therapy are one component of a multi-disciplinary discharge planning process, led by the attending physician.  Recommendations may be updated based on patient status, additional functional criteria and insurance authorization.  Follow Up Recommendations No PT follow up    Assistance Recommended at Discharge    Functional Status Assessment Patient has had a recent decline in their functional status and demonstrates the ability to make significant improvements in function in a reasonable and predictable amount of time.  Equipment Recommendations  Rolling walker (2 wheels)    Recommendations for Other Services       Precautions / Restrictions Precautions Precautions: Fall Required Braces or Orthoses: Other Brace Other Brace: abominal binder      Mobility  Bed Mobility Overal bed mobility: Needs Assistance Bed Mobility: Sidelying to Sit;Sit to Sidelying   Sidelying to sit: Supervision     Sit to sidelying: Min assist General bed mobility comments: increased time using rail and sidelying technique without cues, assist for legs onto bed to sidelying    Transfers Overall transfer level: Needs assistance Equipment used: Rolling walker (2  wheels) Transfers: Sit to/from Stand Sit to Stand: Min guard           General transfer comment: assist for balance    Ambulation/Gait Ambulation/Gait assistance: Min guard Gait Distance (Feet): 100 Feet Assistive device: Rolling walker (2 wheels) Gait Pattern/deviations: Step-through pattern;Decreased stride length;Trunk flexed;Wide base of support     General Gait Details: flexed due to abdominal pain  Stairs            Wheelchair Mobility    Modified Rankin (Stroke Patients Only)       Balance Overall balance assessment: Needs assistance Sitting-balance support: Feet supported Sitting balance-Leahy Scale: Fair Sitting balance - Comments: limited flexion due to pain, but sits unsupported   Standing balance support: Bilateral upper extremity supported Standing balance-Leahy Scale: Poor Standing balance comment: UE support needed                             Pertinent Vitals/Pain Pain Assessment: 0-10 Pain Score: 7  Pain Location: abdomen, above surgical area Pain Descriptors / Indicators: Sharp;Discomfort Pain Intervention(s): Monitored during session;Repositioned    Home Living Family/patient expects to be discharged to:: Private residence Living Arrangements: Parent Available Help at Discharge: Family Type of Home: House Home Access: Level entry     Alternate Level Stairs-Number of Steps: flight Home Layout: Two level Home Equipment: None      Prior Function Prior Level of Function : Independent/Modified Independent               ADLs Comments: worked as travelling CNA and [place]  Hand:  (ambidextrous)    Extremity/Trunk Assessment   Upper Extremity Assessment Upper Extremity Assessment: Overall WFL for tasks assessed    Lower Extremity Assessment Lower Extremity Assessment: RLE deficits/detail;LLE deficits/detail RLE Deficits / Details: AROM WFL, strength hip flexion deferred due to  abdominal pain, knee extension 4-/5, ankle DF 4+/5 RLE Sensation: decreased light touch (reports tingling) LLE Deficits / Details: AROM WFL, strength hip flexion deferred due to abdominal pain, knee extension 4/5, ankle DF 4+/5 LLE Sensation: decreased light touch (reports achy feeling like swollen)    Cervical / Trunk Assessment Cervical / Trunk Assessment: Other exceptions Cervical / Trunk Exceptions: abdominal surgery  Communication   Communication: No difficulties  Cognition Arousal/Alertness: Awake/alert Behavior During Therapy: WFL for tasks assessed/performed Overall Cognitive Status: Within Functional Limits for tasks assessed                                          General Comments General comments (skin integrity, edema, etc.): assist to apply binder in sitting    Exercises     Assessment/Plan    PT Assessment Patient needs continued PT services  PT Problem List Decreased strength;Decreased mobility;Decreased activity tolerance;Decreased balance;Decreased knowledge of use of DME;Pain       PT Treatment Interventions DME instruction;Therapeutic activities;Patient/family education;Gait training;Stair training;Balance training;Therapeutic exercise;Functional mobility training    PT Goals (Current goals can be found in the Care Plan section)  Acute Rehab PT Goals Patient Stated Goal: for pain to get better PT Goal Formulation: With patient Time For Goal Achievement: 06/10/21 Potential to Achieve Goals: Good    Frequency Min 3X/week   Barriers to discharge        Co-evaluation               AM-PAC PT "6 Clicks" Mobility  Outcome Measure Help needed turning from your back to your side while in a flat bed without using bedrails?: A Little Help needed moving from lying on your back to sitting on the side of a flat bed without using bedrails?: A Little Help needed moving to and from a bed to a chair (including a wheelchair)?: A Little Help  needed standing up from a chair using your arms (e.g., wheelchair or bedside chair)?: A Little Help needed to walk in hospital room?: A Little Help needed climbing 3-5 steps with a railing? : A Little 6 Click Score: 18    End of Session Equipment Utilized During Treatment: Other (comment) (binder) Activity Tolerance: Patient limited by pain Patient left: in bed;with call bell/phone within reach   PT Visit Diagnosis: Other abnormalities of gait and mobility (R26.89);Pain;Difficulty in walking, not elsewhere classified (R26.2) Pain - part of body:  (abdomen)    Time: 9735-3299 PT Time Calculation (min) (ACUTE ONLY): 26 min   Charges:   PT Evaluation $PT Eval Moderate Complexity: 1 Mod PT Treatments $Gait Training: 8-22 mins        Magda Kiel, PT Acute Rehabilitation Services [phone] Office:[phone] 05/27/2021   Reginia Naas 05/27/2021, 5:24 PM

## 2021-05-27 NOTE — Progress Notes (Signed)
Mobility Specialist Progress Note:   05/27/21 1035  Mobility  Activity Ambulated in hall  Level of Assistance Standby assist, set-up cues, supervision of patient - no hands on  Assistive Device Front wheel walker  Distance Ambulated (ft) 230 ft  Mobility Ambulated independently in hallway  Mobility Response Tolerated well  Mobility performed by Mobility specialist  $Mobility charge 1 Mobility   Pt c/o 8/10 pain throughout session. Amb doesn't increase pain. Pt eager for ambulation this afternoon.  Nelta Numbers Mobility Specialist  Phone [phone]

## 2021-05-27 NOTE — Progress Notes (Signed)
Gynecology Progress Note  Admission Date: 05/25/2021 Current Date: 05/27/2021 11:58 AM  Alyssa Sullivan is a 40 y.o. G0 HD#2 admitted for symptomatic fibroids and hysterectomy.   History complicated by: Patient Active Problem List   Diagnosis Date Noted   Fibroid uterus 05/25/2021   S/P hysterectomy 05/25/2021   Menorrhagia 02/03/2021   Diverticulosis 01/07/2021   Uterine fibroid 01/07/2021   Abdominal pain 01/07/2021   Screening examination for STD (sexually transmitted disease) 01/07/2021   Chronic hip pain 02/21/2013   Low back pain with sciatica 02/21/2013    ROS and patient/family/surgical history, located on admission H&P note dated 05/25/2021, have been reviewed, and there are no changes except as noted below Yesterday/Overnight Events:  Emesis x 2, continued abdominal pain  Subjective:  Pt seen.  She notes intermittent abdominal pain.  She noted emesis x 2, last time around 7 am.  She did tolerate oral contrast.  She usually notes nausea when she gets up from using the rest room.  She has done some ambulation.  Pt notes flatus 3-4 times.  Radiology called during this writing and hemoperitoneum was noted on CT, primarily in the pelvis near the bladder.  Pt and h/h are stable.  Objective:   Vitals:   05/26/21 2005 05/27/21 0458 05/27/21 0530 05/27/21 0756  BP: (!) 137/98 (!) 152/113 (!) 140/92 (!) 147/102  Pulse: (!) 105 (!) 110 100 98  Resp: 18 20    Temp: 99.5 F (37.5 C) 98.9 F (37.2 C)  98.4 F (36.9 C)  TempSrc: Oral Oral  Oral  SpO2: 97% 98%  98%  Weight:      Height:        Temp:  [98.4 F (36.9 C)-99.5 F (37.5 C)] 98.4 F (36.9 C) (10/27 0756) Pulse Rate:  [97-110] 98 (10/27 0756) Resp:  [16-20] 20 (10/27 0458) BP: (137-166)/(92-113) 147/102 (10/27 0756) SpO2:  [97 %-100 %] 98 % (10/27 0756) I/O last 3 completed shifts: In: 2440.5 [P.O.:270; I.V.:2170.5] Out: 1150 [Urine:1150] Total I/O In: -  Out: 500 [Urine:500]  Intake/Output Summary  (Last 24 hours) at 05/27/2021 1158 Last data filed at 05/27/2021 0800 Gross per 24 hour  Intake 270 ml  Output 1300 ml  Net -1030 ml     Current Vital Signs 24h Vital Sign Ranges  T 98.4 F (36.9 C) Temp  Avg: 98.9 F (37.2 C)  Min: 98.4 F (36.9 C)  Max: 99.5 F (37.5 C)  BP (!) 147/102  BP  Min: 137/98  Max: 166/108  HR 98 Pulse  Avg: 102  Min: 97  Max: 110  RR 20 Resp  Avg: 18  Min: 16  Max: 20  SaO2 98 % Room Air SpO2  Avg: 98.3 %  Min: 97 %  Max: 100 %       24 Hour I/O Current Shift I/O  Time Ins Outs 10/26 0701 - 10/27 0700 In: 1208.6 [P.O.:270; I.V.:938.6] Out: 800 [Urine:800] 10/27 0701 - 10/27 1900 In: -  Out: 500 [Urine:500]   Patient Vitals for the past 12 hrs:  BP Temp Temp src Pulse Resp SpO2  05/27/21 0756 (!) 147/102 98.4 F (36.9 C) Oral 98 -- 98 %  05/27/21 0530 (!) 140/92 -- -- 100 -- --  05/27/21 0458 (!) 152/113 98.9 F (37.2 C) Oral (!) 110 20 98 %     Patient Vitals for the past 24 hrs:  BP Temp Temp src Pulse Resp SpO2  05/27/21 0756 (!) 147/102 98.4 F (36.9 C) Oral 98 --  98 %  05/27/21 0530 (!) 140/92 -- -- 100 -- --  05/27/21 0458 (!) 152/113 98.9 F (37.2 C) Oral (!) 110 20 98 %  05/26/21 2005 (!) 137/98 99.5 F (37.5 C) Oral (!) 105 18 97 %  05/26/21 1718 (!) 166/108 98.6 F (37 C) Oral 97 16 100 %    Physical exam: General appearance: alert, cooperative, appears stated age, and no distress Abdomen:  mild to moderate abdominal distention GU: No gross VB Lungs: clear to auscultation bilaterally Heart: regular rate and rhythm Extremities: no lower extremity edema Skin: WNL Psych: appropriate Neurologic: Grossly normal  Medications Current Facility-Administered Medications  Medication Dose Route Frequency Provider Last Rate Last Admin   acetaminophen (TYLENOL) tablet 650 mg  650 mg Oral Q4H PRN Griffin Basil, MD       bisacodyl (DULCOLAX) EC tablet 5 mg  5 mg Oral Daily PRN Griffin Basil, MD       docusate sodium  (COLACE) capsule 100 mg  100 mg Oral BID Griffin Basil, MD   100 mg at 05/27/21 1057   enoxaparin (LOVENOX) injection 40 mg  40 mg Subcutaneous Q24H Griffin Basil, MD   40 mg at 05/27/21 0749   fluticasone (FLONASE) 50 MCG/ACT nasal spray 1 spray  1 spray Each Nare Daily Constant, Peggy, MD   1 spray at 05/27/21 1059   hydrochlorothiazide (HYDRODIURIL) tablet 12.5 mg  12.5 mg Oral Daily Griffin Basil, MD   12.5 mg at 05/27/21 1057   ibuprofen (ADVIL) tablet 600 mg  600 mg Oral Q6H PRN Griffin Basil, MD       lactated ringers infusion   Intravenous Continuous Griffin Basil, MD   Stopped at 05/26/21 1149   losartan (COZAAR) tablet 25 mg  25 mg Oral Daily Griffin Basil, MD   25 mg at 05/27/21 1057   methocarbamol (ROBAXIN) tablet 500 mg  500 mg Oral Q8H PRN Griffin Basil, MD   500 mg at 05/26/21 1726   ondansetron (ZOFRAN) tablet 4 mg  4 mg Oral Q6H PRN Griffin Basil, MD       Or   ondansetron [facility]) injection 4 mg  4 mg Intravenous Q6H PRN Griffin Basil, MD   4 mg at 05/27/21 0532   oxyCODONE (Oxy IR/ROXICODONE) immediate release tablet 5-10 mg  5-10 mg Oral Q4H PRN Griffin Basil, MD   10 mg at 05/27/21 0533   simethicone (MYLICON) chewable tablet 80 mg  80 mg Oral QID PRN Griffin Basil, MD   80 mg at 05/26/21 1111   zolpidem (AMBIEN) tablet 5 mg  5 mg Oral QHS PRN Griffin Basil, MD          Labs  Recent Labs  Lab 05/25/21 1411 05/26/21 0137  WBC 11.3* 10.7*  HGB 14.4 12.7  HCT 44.6 37.8  PLT 216 229    Recent Labs  Lab 05/25/21 1411 05/26/21 0137  NA  --  135  K  --  3.8  CL  --  102  CO2  --  27  BUN  --  9  CREATININE 0.85 0.77  CALCIUM  --  8.3*  GLUCOSE  --  126*    Radiology CLINICAL DATA:  Postop pain and fever   EXAM: CT ABDOMEN AND PELVIS WITH CONTRAST   TECHNIQUE: Multidetector CT imaging of the abdomen and pelvis was performed using the standard protocol following bolus administration of intravenous contrast.  CONTRAST:  137mL OMNIPAQUE IOHEXOL 300 MG/ML  SOLN   COMPARISON:  CT abdomen and pelvis 12/09/2020   FINDINGS: Lower chest: Mild subsegmental atelectatic changes in the lung bases.   Hepatobiliary: No focal liver abnormality is seen. No gallstones, gallbladder wall thickening, or biliary dilatation.   Pancreas: Unremarkable. No pancreatic ductal dilatation or surrounding inflammatory changes.   Spleen: Normal in size without focal abnormality.   Adrenals/Urinary Tract: Adrenal glands are unremarkable. Kidneys are normal, without renal calculi, suspicious lesion, or hydronephrosis. Small hypodense cyst on the right. Bladder is unremarkable.   Stomach/Bowel: Stomach is unremarkable. No bowel obstruction, or pneumatosis visualized. Gas and fecal material seen throughout the colon with moderate distention of the right colon and cecum measuring up to 6.7 cm and 7.5 cm diameter respectively. Appendix is normal.   Vascular/Lymphatic: No significant vascular abnormality identified. No definite extravasation of contrast visualized. Note is made of surgical changes adjacent to the right inferior epigastric vessels. No bulky lymphadenopathy appreciated.   Reproductive: Status post recent hysterectomy.   Other: Surgical changes of the anterior lower abdominopelvic wall with multiple foci of air within the soft tissues of the right lower abdominopelvic wall as well as multiple foci of intraperitoneal air within the pelvis, mostly non dependent. Small amounts of free air in the upper abdomen as well. There is a moderate to large amount of heterogeneous increased density intraperitoneal likely hematoma/blood products ranging from approximately 50-90 Hounsfield units, mostly in the pelvis with a smaller amount in the upper abdomen.   Musculoskeletal: No acute or significant osseous findings.   IMPRESSION: 1. Recent postoperative changes of hysterectomy. Moderate to large amount of  hemoperitoneum, surgical consultation recommended. 2. Small amount of free air mostly within the pelvis and air in the anterior abdominal wall is likely expected postoperative change. 3. Distension of the right colon and cecum without evidence of obstruction, likely related to ileus.   Ordering provider to be contacted.   Electronically Signed: By: Ofilia Neas M.D. On: 05/27/2021 11:56  Assessment & Plan:  POD 2 s/p TAH Bilateral salpingectomy Pelvic hematoma noted, h/h appears stable. Pt also has an ileus, will manage expectantly Pt will get CT-A of pelvis to make sure the offending vessel is no longer bleeding.  Code Status: Full Code  Lynnda Shields, MD Attending Center for [facility]

## 2021-05-28 LAB — BASIC METABOLIC PANEL
Anion gap: 5 (ref 5–15)
BUN: 8 mg/dL (ref 6–20)
CO2: 32 mmol/L (ref 22–32)
Calcium: 8.6 mg/dL — ABNORMAL LOW (ref 8.9–10.3)
Chloride: 96 mmol/L — ABNORMAL LOW (ref 98–111)
Creatinine, Ser: 0.74 mg/dL (ref 0.44–1.00)
GFR, Estimated: >60 mL/min (ref >60)
Glucose, Bld: 120 mg/dL — ABNORMAL HIGH (ref 70–99)
Potassium: 3.9 mmol/L (ref 3.5–5.1)
Sodium: 133 mmol/L — ABNORMAL LOW (ref 135–145)

## 2021-05-28 LAB — CBC
HCT: 33 % — ABNORMAL LOW (ref 36.0–46.0)
Hemoglobin: 10.8 g/dL — ABNORMAL LOW (ref 12.0–15.0)
MCH: 31 pg (ref 26.0–34.0)
MCHC: 32.7 g/dL (ref 30.0–36.0)
MCV: 94.8 fL (ref 80.0–100.0)
Platelets: 226 10*3/uL (ref 150–400)
RBC: 3.48 MIL/uL — ABNORMAL LOW (ref 3.87–5.11)
RDW: 13.1 % (ref 11.5–15.5)
WBC: 8.5 10*3/uL (ref 4.0–10.5)
nRBC: 0 % (ref 0.0–0.2)

## 2021-05-28 LAB — SURGICAL PATHOLOGY

## 2021-05-28 NOTE — Progress Notes (Signed)
Physical Therapy Treatment Patient Details Name: Alyssa Sullivan MRN: [id number] DOB: 03/05/1981 Today's Date: 05/28/2021   History of Present Illness Patient is a 40 y/o female admitted with fibroid tumors with mass effect and discomfort now s/p TAH/BSO on 05/25/21. Hemoperitoneum . was noted on CT on 10/ PMH positive for GERD, anxiety, HTN, hypothyroidism, asthma, neuropathy and planter fasciitis.    PT Comments    Pt moving slowly but steadily. Was able to do stairs. Encouraged continued mobility.    Recommendations for follow up therapy are one component of a multi-disciplinary discharge planning process, led by the attending physician.  Recommendations may be updated based on patient status, additional functional criteria and insurance authorization.  Follow Up Recommendations  No PT follow up     Assistance Recommended at Discharge    Equipment Recommendations  Rolling walker (2 wheels)    Recommendations for Other Services       Precautions / Restrictions Precautions Required Braces or Orthoses: Other Brace Other Brace: abominal binder     Mobility  Bed Mobility               General bed mobility comments: Pt up in chair    Transfers Overall transfer level: Needs assistance Equipment used: Rolling walker (2 wheels) Transfers: Sit to/from Stand Sit to Stand: Supervision                Ambulation/Gait Ambulation/Gait assistance: Supervision Gait Distance (Feet): 300 Feet Assistive device: Rolling walker (2 wheels) Gait Pattern/deviations: Step-through pattern;Decreased stride length Gait velocity: very slow Gait velocity interpretation: <1.31 ft/sec, indicative of household ambulator General Gait Details: UE support on walker.   Stairs Stairs: Yes Stairs assistance: Min assist Stair Management: One rail Right;Step to pattern;Forwards (+hand held) Number of Stairs: 6 General stair comments: Slow, steady up/down stairs   Wheelchair  Mobility    Modified Rankin (Stroke Patients Only)       Balance Overall balance assessment: No apparent balance deficits (not formally assessed)                                          Cognition Arousal/Alertness: Awake/alert Behavior During Therapy: WFL for tasks assessed/performed Overall Cognitive Status: Within Functional Limits for tasks assessed                                          Exercises      General Comments        Pertinent Vitals/Pain Pain Assessment: Faces Faces Pain Scale: Hurts even more Pain Location: abdominal Pain Descriptors / Indicators: Grimacing;Guarding Pain Intervention(s): Limited activity within patient's tolerance    Home Living                          Prior Function            PT Goals (current goals can now be found in the care plan section) Acute Rehab PT Goals Patient Stated Goal: to get walking more with mother Progress towards PT goals: Progressing toward goals    Frequency    Min 3X/week      PT Plan Current plan remains appropriate    Co-evaluation              AM-PAC PT "6 Clicks"  Mobility   Outcome Measure  Help needed turning from your back to your side while in a flat bed without using bedrails?: A Little Help needed moving from lying on your back to sitting on the side of a flat bed without using bedrails?: A Little Help needed moving to and from a bed to a chair (including a wheelchair)?: A Little Help needed standing up from a chair using your arms (e.g., wheelchair or bedside chair)?: None Help needed to walk in hospital room?: A Little Help needed climbing 3-5 steps with a railing? : A Little 6 Click Score: 19    End of Session   Activity Tolerance: Patient limited by pain Patient left: in chair;with call bell/phone within reach;with family/visitor present   PT Visit Diagnosis: Other abnormalities of gait and mobility (R26.89);Pain;Difficulty  in walking, not elsewhere classified (R26.2) Pain - part of body:  (abdomen)     Time: 2919-1660 PT Time Calculation (min) (ACUTE ONLY): 25 min  Charges:  $Gait Training: 23-37 mins                     [place] Pager [phone] Office [place] 05/28/2021, 4:35 PM

## 2021-05-28 NOTE — Progress Notes (Signed)
Mobility Specialist Progress Note:   05/28/21 0955  Mobility  Activity Ambulated in hall  Level of Assistance Standby assist, set-up cues, supervision of patient - no hands on  Assistive Device Front wheel walker  Distance Ambulated (ft) 230 ft  Mobility Ambulated with assistance in hallway  Mobility Response Tolerated well  Mobility performed by Mobility specialist  $Mobility charge 1 Mobility   Pt c/o 8/10 abdominal pain. Eager for PT and another mobility session later today.   Nelta Numbers Mobility Specialist  Phone [phone]

## 2021-05-28 NOTE — Progress Notes (Signed)
Gynecology Progress Note  Admission Date: 05/25/2021 Current Date: 05/28/2021 1:04 PM  Alyssa Sullivan is a 40 y.o. No obstetric history on file. HD#3 admitted for symptomatic fibroids and s/p hysterectomy   History complicated by: Patient Active Problem List   Diagnosis Date Noted   Fibroid uterus 05/25/2021   S/P hysterectomy 05/25/2021   Menorrhagia 02/03/2021   Diverticulosis 01/07/2021   Uterine fibroid 01/07/2021   Abdominal pain 01/07/2021   Screening examination for STD (sexually transmitted disease) 01/07/2021   Chronic hip pain 02/21/2013   Low back pain with sciatica 02/21/2013    ROS and patient/family/surgical history, located on admission H&P note dated 05/25/2021, have been reviewed, and there are no changes except as noted below Yesterday/Overnight Events:  None significant  Subjective:  Pt feeling better today.  Notes consistent flatus.  Minimal nausea.  No emesis.  Pt is tolerating juice, coffee and water.  Did not do well with jello.  Abdominal pain primarily with movement.  Objective:   Vitals:   05/27/21 2009 05/27/21 2120 05/28/21 0335 05/28/21 0733  BP: 133/82  112/60 (!) 128/97  Pulse:  (!) 101 (!) 106 (!) 104  Resp: 20  18 19   Temp: 99.2 F (37.3 C)  98.5 F (36.9 C) 98.4 F (36.9 C)  TempSrc: Oral  Oral Oral  SpO2: 99%  98% 99%  Weight:      Height:        Temp:  [98.4 F (36.9 C)-99.2 F (37.3 C)] 98.4 F (36.9 C) (10/28 0733) Pulse Rate:  [101-106] 104 (10/28 0733) Resp:  [18-20] 19 (10/28 0733) BP: (112-133)/(60-97) 128/97 (10/28 0733) SpO2:  [98 %-99 %] 99 % (10/28 0733) I/O last 3 completed shifts: In: 510 [P.O.:510] Out: 1100 [Urine:1100] No intake/output data recorded.  Intake/Output Summary (Last 24 hours) at 05/28/2021 1304 Last data filed at 05/28/2021 0045 Gross per 24 hour  Intake 240 ml  Output 600 ml  Net -360 ml     Current Vital Signs 24h Vital Sign Ranges  T 98.4 F (36.9 C) Temp  Avg: 98.7 F (37.1  C)  Min: 98.4 F (36.9 C)  Max: 99.2 F (37.3 C)  BP (!) 128/97  BP  Min: 112/60  Max: 133/82  HR (!) 104  Pulse  Avg: 103.7  Min: 101  Max: 106  RR 19 Resp  Avg: 19  Min: 18  Max: 20  SaO2 99 % Room Air SpO2  Avg: 98.7 %  Min: 98 %  Max: 99 %       24 Hour I/O Current Shift I/O  Time Ins Outs 10/27 0701 - 10/28 0700 In: 240 [P.O.:240] Out: 1100 [Urine:1100] No intake/output data recorded.   Patient Vitals for the past 12 hrs:  BP Temp Temp src Pulse Resp SpO2  05/28/21 0733 (!) 128/97 98.4 F (36.9 C) Oral (!) 104 19 99 %  05/28/21 0335 112/60 98.5 F (36.9 C) Oral (!) 106 18 98 %     Patient Vitals for the past 24 hrs:  BP Temp Temp src Pulse Resp SpO2  05/28/21 0733 (!) 128/97 98.4 F (36.9 C) Oral (!) 104 19 99 %  05/28/21 0335 112/60 98.5 F (36.9 C) Oral (!) 106 18 98 %  05/27/21 2120 -- -- -- (!) 101 -- --  05/27/21 2009 133/82 99.2 F (37.3 C) Oral -- 20 99 %    Physical exam: General appearance: alert, cooperative, appears stated age, and no distress Abdomen:  soft, appropriately tender, hypoactive bowel  sounds GU: No gross VB Lungs: clear to auscultation bilaterally Heart: regular rate and rhythm Extremities: no lower extremity edema Skin: WNL, incision clean dry and intact Psych: appropriate Neurologic: Grossly normal  Medications Current Facility-Administered Medications  Medication Dose Route Frequency Provider Last Rate Last Admin   acetaminophen (TYLENOL) tablet 650 mg  650 mg Oral Q4H PRN Griffin Basil, MD       bisacodyl (DULCOLAX) EC tablet 5 mg  5 mg Oral Daily PRN Griffin Basil, MD       docusate sodium (COLACE) capsule 100 mg  100 mg Oral BID Griffin Basil, MD   100 mg at 05/28/21 1035   enoxaparin (LOVENOX) injection 40 mg  40 mg Subcutaneous Q24H Griffin Basil, MD   40 mg at 05/28/21 0751   fluticasone (FLONASE) 50 MCG/ACT nasal spray 1 spray  1 spray Each Nare Daily Constant, Peggy, MD   1 spray at 05/28/21 1035    hydrochlorothiazide (HYDRODIURIL) tablet 12.5 mg  12.5 mg Oral Daily Griffin Basil, MD   12.5 mg at 05/28/21 1035   ibuprofen (ADVIL) tablet 600 mg  600 mg Oral Q6H PRN Griffin Basil, MD       lactated ringers infusion   Intravenous Continuous Griffin Basil, MD 125 mL/hr at 05/28/21 1036 New Bag at 05/28/21 1036   losartan (COZAAR) tablet 25 mg  25 mg Oral Daily Griffin Basil, MD   25 mg at 05/28/21 1035   methocarbamol (ROBAXIN) tablet 500 mg  500 mg Oral Q8H PRN Griffin Basil, MD   500 mg at 05/28/21 0002   ondansetron (ZOFRAN) tablet 4 mg  4 mg Oral Q6H PRN Griffin Basil, MD       Or   ondansetron [facility]) injection 4 mg  4 mg Intravenous Q6H PRN Griffin Basil, MD   4 mg at 05/28/21 1222   oxyCODONE (Oxy IR/ROXICODONE) immediate release tablet 5-10 mg  5-10 mg Oral Q4H PRN Griffin Basil, MD   10 mg at 05/28/21 1222   simethicone (MYLICON) chewable tablet 80 mg  80 mg Oral QID PRN Griffin Basil, MD   80 mg at 05/26/21 1111   zolpidem (AMBIEN) tablet 5 mg  5 mg Oral QHS PRN Griffin Basil, MD          Labs  Recent Labs  Lab 05/26/21 0137 05/27/21 1334 05/28/21 0258  WBC 10.7* 9.2 8.5  HGB 12.7 12.0 10.8*  HCT 37.8 35.8* 33.0*  PLT 229 235 226    Recent Labs  Lab 05/25/21 1411 05/26/21 0137 05/28/21 0258  NA  --  135 133*  K  --  3.8 3.9  CL  --  102 96*  CO2  --  27 32  BUN  --  9 8  CREATININE 0.85 0.77 0.74  CALCIUM  --  8.3* 8.6*  GLUCOSE  --  126* 120*    Radiology Pelvic hematoma, no active bleeding, mild/moderate ileus  Assessment & Plan:  POD 3 s/p TAH, bilateral salpingectomy Pain orally controlled with narcotics Continued flatus is reassuring, continue with clears today as tolerated.  Will try full liquids on 10/29 if still doing well. May get abdominal films to reassess ileus on 10/29  Code Status: Full Code  Lynnda Shields, MD Attending Center for [facility])

## 2021-05-28 NOTE — Progress Notes (Signed)
Mobility Specialist Progress Note:   05/28/21 1400  Mobility  Activity Ambulated in hall  Level of Assistance Contact guard assist, steadying assist  Assistive Device Other (Comment) (IV Pole)  Distance Ambulated (ft) 100 ft  Mobility Ambulated with assistance in hallway  Mobility Response Tolerated well  Mobility performed by Mobility specialist  Bed Position Chair  $Mobility charge 1 Mobility   Second session today. Pt opted to ambulate without RW. Required contactG d/t pt comfort as well as BLE weakness. Eager for PT this afternoon.   Nelta Numbers Mobility Specialist  Phone [phone]

## 2021-05-28 NOTE — Plan of Care (Signed)
  Problem: Pain Managment: Goal: General experience of comfort will improve Outcome: Progressing   

## 2021-05-29 NOTE — Progress Notes (Signed)
Gynecology Progress Note  Admission Date: 05/25/2021 Current Date: 05/29/2021 11:53 AM  Alyssa Sullivan is a 40 y.o. G0 HD#4 admitted for fibroid uterus, s/p hysterectomy   History complicated by: Patient Active Problem List   Diagnosis Date Noted   Fibroid uterus 05/25/2021   S/P hysterectomy 05/25/2021   Menorrhagia 02/03/2021   Diverticulosis 01/07/2021   Uterine fibroid 01/07/2021   Abdominal pain 01/07/2021   Screening examination for STD (sexually transmitted disease) 01/07/2021   Chronic hip pain 02/21/2013   Low back pain with sciatica 02/21/2013    ROS and patient/family/surgical history, located on admission H&P note dated 05/25/2021, have been reviewed, and there are no changes except as noted below Yesterday/Overnight Events:  Non significant  Subjective:  Pt seen.  She notes continued flatus and ambulation.  She denies emesis.  Pain well controlled and is improving.  She is doing well with PT.  Pt has tolerated full liquids and her mother's pea soup.  Seems in good spirits.  Objective:   Vitals:   05/28/21 1702 05/28/21 1942 05/29/21 0352 05/29/21 0826  BP: 114/87 108/81 134/77 128/89  Pulse: 100 81 99 95  Resp: 19 17 18 17   Temp:  98.3 F (36.8 C) 98.8 F (37.1 C) 98.5 F (36.9 C)  TempSrc:  Oral Oral Oral  SpO2: 99% 99% 100% 100%  Weight:      Height:        Temp:  [98.3 F (36.8 C)-98.8 F (37.1 C)] 98.5 F (36.9 C) (10/29 0826) Pulse Rate:  [81-100] 95 (10/29 0826) Resp:  [17-19] 17 (10/29 0826) BP: (108-134)/(77-89) 128/89 (10/29 0826) SpO2:  [99 %-100 %] 100 % (10/29 0826) I/O last 3 completed shifts: In: 2567.8 [P.O.:1200; I.V.:1367.8] Out: 1200 [Urine:1200] No intake/output data recorded.  Intake/Output Summary (Last 24 hours) at 05/29/2021 1153 Last data filed at 05/29/2021 0357 Gross per 24 hour  Intake 2207.77 ml  Output 600 ml  Net 1607.77 ml     Current Vital Signs 24h Vital Sign Ranges  T 98.5 F (36.9 C) Temp  Avg:  98.6 F (37 C)  Min: 98.3 F (36.8 C)  Max: 98.8 F (37.1 C)  BP 128/89 BP  Min: 108/81  Max: 134/77  HR 95 Pulse  Avg: 93.8  Min: 81  Max: 100  RR 17 Resp  Avg: 17.8  Min: 17  Max: 19  SaO2 100 % Room Air SpO2  Avg: 99.5 %  Min: 99 %  Max: 100 %       24 Hour I/O Current Shift I/O  Time Ins Outs 10/28 0701 - 10/29 0700 In: 2327.8 [P.O.:960; I.V.:1367.8] Out: 600 [Urine:600] No intake/output data recorded.   Patient Vitals for the past 12 hrs:  BP Temp Temp src Pulse Resp SpO2  05/29/21 0826 128/89 98.5 F (36.9 C) Oral 95 17 100 %  05/29/21 0352 134/77 98.8 F (37.1 C) Oral 99 18 100 %     Patient Vitals for the past 24 hrs:  BP Temp Temp src Pulse Resp SpO2  05/29/21 0826 128/89 98.5 F (36.9 C) Oral 95 17 100 %  05/29/21 0352 134/77 98.8 F (37.1 C) Oral 99 18 100 %  05/28/21 1942 108/81 98.3 F (36.8 C) Oral 81 17 99 %  05/28/21 1702 114/87 -- -- 100 19 99 %  05/28/21 1701 -- 98.8 F (37.1 C) -- -- -- --    Physical exam: General appearance: alert, cooperative, appears stated age, and no distress Abdomen:  soft, appropriately  tender, nondistended, active bowel sounds GU: No gross VB Lungs: clear to auscultation bilaterally Heart: regular rate and rhythm Extremities: no lower extremity edema, no calf pain bilaterally Skin: WNL, incision clean dry and intact Psych: appropriate Neurologic: Grossly normal  Medications Current Facility-Administered Medications  Medication Dose Route Frequency Provider Last Rate Last Admin   acetaminophen (TYLENOL) tablet 650 mg  650 mg Oral Q4H PRN Griffin Basil, MD       bisacodyl (DULCOLAX) EC tablet 5 mg  5 mg Oral Daily PRN Griffin Basil, MD   5 mg at 05/28/21 1836   docusate sodium (COLACE) capsule 100 mg  100 mg Oral BID Griffin Basil, MD   100 mg at 05/29/21 1017   enoxaparin (LOVENOX) injection 40 mg  40 mg Subcutaneous Q24H Griffin Basil, MD   40 mg at 05/29/21 1017   fluticasone (FLONASE) 50 MCG/ACT nasal  spray 1 spray  1 spray Each Nare Daily Constant, Peggy, MD   1 spray at 05/29/21 1018   hydrochlorothiazide (HYDRODIURIL) tablet 12.5 mg  12.5 mg Oral Daily Griffin Basil, MD   12.5 mg at 05/29/21 1017   ibuprofen (ADVIL) tablet 600 mg  600 mg Oral Q6H PRN Griffin Basil, MD       lactated ringers infusion   Intravenous Continuous Griffin Basil, MD 125 mL/hr at 05/29/21 0508 New Bag at 05/29/21 0508   losartan (COZAAR) tablet 25 mg  25 mg Oral Daily Griffin Basil, MD   25 mg at 05/29/21 1017   methocarbamol (ROBAXIN) tablet 500 mg  500 mg Oral Q8H PRN Griffin Basil, MD   500 mg at 05/29/21 0854   ondansetron (ZOFRAN) tablet 4 mg  4 mg Oral Q6H PRN Griffin Basil, MD       Or   ondansetron [facility]) injection 4 mg  4 mg Intravenous Q6H PRN Griffin Basil, MD   4 mg at 05/28/21 1222   oxyCODONE (Oxy IR/ROXICODONE) immediate release tablet 5-10 mg  5-10 mg Oral Q4H PRN Griffin Basil, MD   10 mg at 05/29/21 1024   simethicone (MYLICON) chewable tablet 80 mg  80 mg Oral QID PRN Griffin Basil, MD   80 mg at 05/26/21 1111   zolpidem (AMBIEN) tablet 5 mg  5 mg Oral QHS PRN Griffin Basil, MD          Labs  Recent Labs  Lab 05/26/21 0137 05/27/21 1334 05/28/21 0258  WBC 10.7* 9.2 8.5  HGB 12.7 12.0 10.8*  HCT 37.8 35.8* 33.0*  PLT 229 235 226    Recent Labs  Lab 05/25/21 1411 05/26/21 0137 05/28/21 0258  NA  --  135 133*  K  --  3.8 3.9  CL  --  102 96*  CO2  --  27 32  BUN  --  9 8  CREATININE 0.85 0.77 0.74  CALCIUM  --  8.3* 8.6*  GLUCOSE  --  126* 120*    Radiology N/a  Assessment & Plan:  POD 4 s/p TAH bilateral salpingectomy  Ileus seems to be improving. Advance diet slowly Pt is meeting activities of daily living.  She is starting to ask about going home. Reassess in am after advancing diet, possible d/c on 10/30 or 10/31 Code Status: Full Code  Lynnda Shields, MD Attending Center for [facility])

## 2021-05-30 MED ORDER — DOCUSATE SODIUM 100 MG PO CAPS: 100.0000 mg | ORAL_CAPSULE | Freq: Every day | ORAL | 0 refills | Status: DC

## 2021-05-30 MED ORDER — ONDANSETRON HCL 4 MG PO TABS: 4.0000 mg | ORAL_TABLET | Freq: Four times a day (QID) | ORAL | 0 refills | Status: AC | PRN

## 2021-05-30 MED ORDER — OXYCODONE HCL 5 MG PO TABS: 5.0000 mg | ORAL_TABLET | ORAL | 0 refills | Status: AC | PRN

## 2021-05-30 NOTE — TOC Transition Note (Signed)
Transition of Care [facility]) - CM/SW Discharge Note   Patient Details  Name: Alyssa Sullivan MRN: [id number] Date of Birth: February 25, 1981  Transition of Care [facility]) CM/SW Contact:  Bartholomew Crews, RN Phone Number: [phone] 05/30/2021, 2:02 PM   Clinical Narrative:     Acknowledging TOC consult for RW. Referral called to [employer] for delivery to the room.   Final next level of care: Home/Self Care Barriers to Discharge: No Barriers Identified   Patient Goals and CMS Choice        Discharge Placement                       Discharge Plan and Services                                     Social Determinants of Health (SDOH) Interventions     Readmission Risk Interventions No flowsheet data found.

## 2021-05-30 NOTE — Discharge Summary (Signed)
Physician Discharge Summary  Patient ID: Alyssa Sullivan MRN: [id number] DOB/AGE: 42-42-1982 42 y.o.  Admit date: 05/25/2021 Discharge date: 05/30/2021  Admission Diagnoses:Symptomatic uterine fibroids  Discharge Diagnoses:  Active Problems:   Fibroid uterus   S/P hysterectomy   Discharged Condition: good  Hospital Course: Pt had total abdominal hysterectomy and bilateral salpingectomy.  Please see operative note.  Pt initially did well, but did have increased abdominal pain and nausea on POD 1 and 2.  CT scan was ordered which showed pelvic hematoma and ileus.  Nutrition was decreased to sips.  POD 3 patient had consistent flatus and was tolerating clear liquids.  POD 4 pt had increased ambulation and continued flatus.  Pain was improved and she had tolerated full liquids and soup.  Pt was discharged on POD 5 after pt tolerated regular diet.  Consults: None  Significant Diagnostic Studies: radiology: CT scan: CT of abdomen pelvis and CT angiogram  Treatments: analgesia: oxycodone, anticoagulation: LMW heparin, and surgery: abdominal hysterectomy  Discharge Exam: Blood pressure 126/89, pulse 92, temperature 98.4 F (36.9 C), temperature source Oral, resp. rate 18, height 5\' 8"  (1.727 m), weight 90.7 kg, last menstrual period 05/04/2021, SpO2 99 %. General appearance: alert, cooperative, and no distress Head: Normocephalic, without obvious abnormality, atraumatic Resp: clear to auscultation bilaterally Cardio: regular rate and rhythm GI: abdomen soft, nondistended, non-tympanic. Appropriately tender, active bowel sounds Extremities: extremities normal, atraumatic, no cyanosis or edema and Homans sign is negative, no sign of DVT Skin: Skin color, texture, turgor normal. No rashes or lesions Incision/Wound:  honeycomb bandage removed, incision clean dry and intact with slightly soiled steristrips from previous oozing  Disposition: Discharge disposition: 01-Home or Self  Care       Discharge Instructions     Call MD for:  difficulty breathing, headache or visual disturbances   Complete by: As directed    Call MD for:  persistant dizziness or light-headedness   Complete by: As directed    Call MD for:  persistant nausea and vomiting   Complete by: As directed    Call MD for:  redness, tenderness, or signs of infection (pain, swelling, redness, odor or green/yellow discharge around incision site)   Complete by: As directed    Call MD for:  severe uncontrolled pain   Complete by: As directed    Call MD for:  temperature >100.4   Complete by: As directed    Diet - low sodium heart healthy   Complete by: As directed    Discharge wound care:   Complete by: As directed    Pt may shower, no soak baths, keep incision clean dry and intact, remove steristrips in 3-4 days after shower   Driving Restrictions   Complete by: As directed    No driving while taking narcotics or until pt can slam on brakes without flinching   Increase activity slowly   Complete by: As directed    Lifting restrictions   Complete by: As directed    No lifting more than 5-10 pounds for 3 weeks   Sexual Activity Restrictions   Complete by: As directed    Pelvic rest 6-8 weeks      Allergies as of 05/30/2021       Reactions   Banana Swelling   Codeine Hives   Cranberry    Throat closes up   Fig Extract [ficus] Hives   Penicillins Hives   + makes edges of hair fall out.  Medication List     STOP taking these medications    leuprolide 3.75 MG injection Commonly known as: LUPRON   Lupron Depot (64-Month) 11.25 MG injection Generic drug: leuprolide   ondansetron 8 MG disintegrating tablet Commonly known as: Zofran ODT   OVER THE COUNTER MEDICATION       TAKE these medications    cetirizine 10 MG tablet Commonly known as: ZYRTEC Take 10 mg by mouth daily as needed for allergies.   cyclobenzaprine 10 MG tablet Commonly known as: FLEXERIL Take  1 tablet (10 mg total) by mouth 3 (three) times daily as needed for muscle spasms.   docusate sodium 100 MG capsule Commonly known as: COLACE Take 1 capsule (100 mg total) by mouth daily.   fluticasone 50 MCG/ACT nasal spray Commonly known as: FLONASE Place 1 spray into both nostrils 2 (two) times daily.   HAIR/SKIN/NAILS PO Take 1 tablet by mouth daily.   hydrochlorothiazide 12.5 MG capsule Commonly known as: MICROZIDE Take 1 capsule (12.5 mg total) by mouth daily. What changed: additional instructions   ibuprofen 800 MG tablet Commonly known as: ADVIL Take 1 tablet (800 mg total) by mouth every 8 (eight) hours as needed. What changed: reasons to take this   losartan 25 MG tablet Commonly known as: COZAAR Take 1 tablet (25 mg total) by mouth daily. What changed: additional instructions   MULTIVITAMIN ADULT PO Take 1 tablet by mouth daily.   ondansetron 4 MG tablet Commonly known as: ZOFRAN Take 1 tablet (4 mg total) by mouth every 6 (six) hours as needed for nausea.   oxyCODONE 5 MG immediate release tablet Commonly known as: Oxy IR/ROXICODONE Take 1-2 tablets (5-10 mg total) by mouth every 4 (four) hours as needed for up to 7 days for moderate pain.   PREVAGEN PO Take 1 tablet by mouth daily.               Durable Medical Equipment  (From admission, onward)           Start     Ordered   05/30/21 1152  For home use only DME Walker rolling  Once       Question Answer Comment  Walker: With [place] Wheels   Patient needs a walker to treat with the following condition Decreased functional mobility      05/30/21 1152              Discharge Care Instructions  (From admission, onward)           Start     Ordered   05/30/21 0000  Discharge wound care:       Comments: Pt may shower, no soak baths, keep incision clean dry and intact, remove steristrips in 3-4 days after shower   05/30/21 1203            Follow-up Information     Griffin Basil, MD. Schedule an appointment as soon as possible for a visit in 1 month(s).   Specialty: Obstetrics and Gynecology Why: post operative appointment Contact information: [place] [address] [phone]                 Signed: Griffin Basil 05/30/2021, 12:04 PM

## 2021-06-03 ENCOUNTER — Emergency Department (HOSPITAL_COMMUNITY)
Admission: EM | Admit: 2021-06-03 | Discharge: 2021-06-04 | Disposition: A | Discharge status: Home / Self Care | Payer: No Typology Code available for payment source | Attending: Emergency Medicine | Admitting: Emergency Medicine

## 2021-06-03 DIAGNOSIS — Z5321 Procedure and treatment not carried out due to patient leaving prior to being seen by health care provider: Secondary | ICD-10-CM | POA: Insufficient documentation

## 2021-06-03 DIAGNOSIS — T8131XA Disruption of external operation (surgical) wound, not elsewhere classified, initial encounter: Secondary | ICD-10-CM | POA: Insufficient documentation

## 2021-06-03 DIAGNOSIS — T889XXA Complication of surgical and medical care, unspecified, initial encounter: Secondary | ICD-10-CM | POA: Insufficient documentation

## 2021-06-03 MED ORDER — ONDANSETRON 4 MG PO TBDP
4.0000 mg | ORAL_TABLET | Freq: Once | ORAL | Status: AC
  Administered 2021-06-04: 4 mg via ORAL
  Filled 2021-06-03: qty 1

## 2021-06-03 MED ORDER — OXYCODONE-ACETAMINOPHEN 5-325 MG PO TABS
2.0000 | ORAL_TABLET | Freq: Once | ORAL | Status: AC
Start: 2021-06-04 — End: 2021-06-04
  Administered 2021-06-04: 2 via ORAL
  Filled 2021-06-03: qty 2

## 2021-06-03 NOTE — ED Provider Notes (Signed)
Emergency Medicine Provider Triage Evaluation Note  Alyssa Sullivan , a 40 y.o. female  was evaluated in triage.  Pt complains of lower abdominal pain.  Reports recent hysterectomy (05/25/2021).  States that she is having purulent discharge from the incision site.  States that she has not been compliant and lifting restrictions.  Hysterectomy performed by Dr. Elgie Congo.  Review of Systems  Positive: Abdominal pain Negative: Fever, chills  Physical Exam  LMP 05/04/2021 Comment: UPREG NEGATIVE PRE-OP Gen:   Awake, no distress   Resp:  Normal effort  MSK:   Moves extremities without difficulty  Other:  Serosanguineous oozing from abdominal incision, there is some surrounding warmth and mild erythema  Medical Decision Making  Medically screening exam initiated at 11:45 PM.  Appropriate orders placed.  Alyssa Sullivan was informed that the remainder of the evaluation will be completed by another provider, this initial triage assessment does not replace that evaluation, and the importance of remaining in the ED until their evaluation is complete.  Abdominal pain/postop problem   Montine Circle, PA-C 06/03/21 2346    Maudie Flakes, MD 06/04/21 [phone]

## 2021-06-03 NOTE — ED Triage Notes (Signed)
Pt presents w concern for wound dehiscence after hysterectomy 10/25. States she was advised to "take it easy," but didn't follow instruction. Weeping drainage from site per pt.

## 2021-06-04 ENCOUNTER — Encounter: Payer: Self-pay | Admitting: *Deleted

## 2021-06-04 ENCOUNTER — Emergency Department (HOSPITAL_COMMUNITY): Payer: No Typology Code available for payment source

## 2021-06-04 ENCOUNTER — Telehealth: Payer: Self-pay | Admitting: *Deleted

## 2021-06-04 ENCOUNTER — Other Ambulatory Visit: Payer: Self-pay

## 2021-06-04 ENCOUNTER — Ambulatory Visit: Payer: No Typology Code available for payment source | Admitting: Obstetrics & Gynecology

## 2021-06-04 ENCOUNTER — Ambulatory Visit (INDEPENDENT_AMBULATORY_CARE_PROVIDER_SITE_OTHER): Payer: No Typology Code available for payment source | Admitting: Obstetrics & Gynecology

## 2021-06-04 VITALS — BP 112/72 | HR 104

## 2021-06-04 DIAGNOSIS — Z90710 Acquired absence of both cervix and uterus: Secondary | ICD-10-CM

## 2021-06-04 LAB — CBC WITH DIFFERENTIAL/PLATELET
Abs Immature Granulocytes: 0.05 10*3/uL (ref 0.00–0.07)
Basophils Absolute: 0 10*3/uL (ref 0.0–0.1)
Basophils Relative: 0 %
Eosinophils Absolute: 0.1 10*3/uL (ref 0.0–0.5)
Eosinophils Relative: 1 %
HCT: 34.4 % — ABNORMAL LOW (ref 36.0–46.0)
Hemoglobin: 11.4 g/dL — ABNORMAL LOW (ref 12.0–15.0)
Immature Granulocytes: 1 %
Lymphocytes Relative: 21 %
Lymphs Abs: 2 10*3/uL (ref 0.7–4.0)
MCH: 31.1 pg (ref 26.0–34.0)
MCHC: 33.1 g/dL (ref 30.0–36.0)
MCV: 94 fL (ref 80.0–100.0)
Monocytes Absolute: 0.6 10*3/uL (ref 0.1–1.0)
Monocytes Relative: 6 %
Neutro Abs: 6.9 10*3/uL (ref 1.7–7.7)
Neutrophils Relative %: 71 %
Platelets: 416 10*3/uL — ABNORMAL HIGH (ref 150–400)
RBC: 3.66 MIL/uL — ABNORMAL LOW (ref 3.87–5.11)
RDW: 12.4 % (ref 11.5–15.5)
WBC: 9.7 10*3/uL (ref 4.0–10.5)
nRBC: 0 % (ref 0.0–0.2)

## 2021-06-04 LAB — URINALYSIS, ROUTINE W REFLEX MICROSCOPIC
Bilirubin Urine: NEGATIVE
Glucose, UA: NEGATIVE mg/dL
Ketones, ur: NEGATIVE mg/dL
Nitrite: NEGATIVE
Protein, ur: NEGATIVE mg/dL
Specific Gravity, Urine: 1.009 (ref 1.005–1.030)
pH: 5 (ref 5.0–8.0)

## 2021-06-04 LAB — COMPREHENSIVE METABOLIC PANEL
ALT: 16 U/L (ref 0–44)
AST: 23 U/L (ref 15–41)
Albumin: 2.9 g/dL — ABNORMAL LOW (ref 3.5–5.0)
Alkaline Phosphatase: 45 U/L (ref 38–126)
Anion gap: 7 (ref 5–15)
BUN: 12 mg/dL (ref 6–20)
CO2: 26 mmol/L (ref 22–32)
Calcium: 8.7 mg/dL — ABNORMAL LOW (ref 8.9–10.3)
Chloride: 99 mmol/L (ref 98–111)
Creatinine, Ser: 0.69 mg/dL (ref 0.44–1.00)
GFR, Estimated: >60 mL/min (ref >60)
Glucose, Bld: 118 mg/dL — ABNORMAL HIGH (ref 70–99)
Potassium: 3.8 mmol/L (ref 3.5–5.1)
Sodium: 132 mmol/L — ABNORMAL LOW (ref 135–145)
Total Bilirubin: 0.7 mg/dL (ref 0.3–1.2)
Total Protein: 7.1 g/dL (ref 6.5–8.1)

## 2021-06-04 LAB — LIPASE, BLOOD: Lipase: 33 U/L (ref 11–51)

## 2021-06-04 MED ORDER — IOHEXOL 350 MG/ML SOLN
80.0000 mL | Freq: Once | INTRAVENOUS | Status: AC | PRN
  Administered 2021-06-04: 80 mL via INTRAVENOUS

## 2021-06-04 NOTE — ED Notes (Signed)
Patient stated she was leaving and will call the doctor this morning .

## 2021-06-04 NOTE — Telephone Encounter (Addendum)
Patient called office and spoke with [facility]. She stated that she was seen @ ED last night due to pus and fluid leaking from incision site. She reports having blood work and CT scan however did not stay to receive the results. Today she is in serious pain and wants to know what to do. Per chart review, pt had hysterectomy on 10/25. Pt status, test results  and history were reviewed by Dr. Roselie Awkward who stated that pt needs office visit today for further evaluation. I called pt and left message on her personal VM stating that we would like her to come to office today no later than 11:00 am (office closes @ 12:00) for evaluation. If she is not able to come in by that time, she will need to return to ED for evaluation to determine next steps in her care. No treatment can be determined over the phone - she must be seen in person. Pt may call back if she has questions.

## 2021-06-04 NOTE — Progress Notes (Signed)
Subjective:     Alyssa Sullivan is a 40 y.o. female who presents to the clinic 10 days status post total abdominal hysterectomy for fibroids. Eating a regular diet  with some decreased appetite bur drinking normally  . Bowel movements are normal. Pain is controlled with current analgesics. Medications being used: narcotic analgesics including Percocet. She had serosanguinous fluid drainage from her incision yesterday and she was seen in the ED and DT was done and reviewed. The following portions of the patient's history were reviewed and updated as appropriate: allergies, current medications, past family history, past medical history, past social history, past surgical history, and problem list.  Review of Systems Pertinent items are noted in HPI.    Objective:    BP 112/72   Pulse (!) 104  General:  alert, cooperative, and mild distress  Abdomen: soft, non-tender  Incision:   healing well, no erythema, no hernia, no seroma, no swelling, well approximated, no dehiscence, incision well approximated    Very small amount of SS drainage on abd pad and no fluid expressed  Narrative & Impression  CLINICAL DATA:  Recent hysterectomy now with wound dehiscence. Abdominal abscess/infection suspected.   EXAM: CT ABDOMEN AND PELVIS WITH CONTRAST   TECHNIQUE: Multidetector CT imaging of the abdomen and pelvis was performed using the standard protocol following bolus administration of intravenous contrast.   CONTRAST:  72mL OMNIPAQUE IOHEXOL 350 MG/ML SOLN   COMPARISON:  05/27/2021   FINDINGS: Lower chest: The visualized lung bases are clear. The visualized heart and pericardium are unremarkable.   Hepatobiliary: No focal liver abnormality is seen. No gallstones, gallbladder wall thickening, or biliary dilatation.   Pancreas: Unremarkable   Spleen: Unremarkable   Adrenals/Urinary Tract: The adrenal glands are unremarkable. Tiny hypodensity within the interpolar region of the  right kidney medially is too small to characterize. The kidneys are otherwise unremarkable. The bladder is unremarkable.   Stomach/Bowel: Mild sigmoid diverticulosis. Moderate retained stool throughout the colon. The stomach, small bowel, and large bowel are otherwise unremarkable. Previously noted free intraperitoneal gas has nearly completely resolved. High attenuation fluid within the pelvis in keeping with blood product appears relatively stable since prior examination.   Vascular/Lymphatic: No significant vascular findings are present. No enlarged abdominal or pelvic lymph nodes.   Reproductive: The uterus is absent.  No adnexal masses are seen.   Other: Postsurgical changes are seen within the anterior abdominal wall in keeping with infraumbilical laparotomy. Decreasing subcutaneous gas within the anterior abdominal wall. No loculated subcutaneous fluid collections are identified. The deep abdominal fascia appears intact.   Musculoskeletal: No acute bone abnormality. No lytic or blastic bone lesion.   IMPRESSION: Status post hysterectomy. Stable moderate hemoperitoneum. Nearly resolved free intraperitoneal gas and decreasing abdominal wall subcutaneous gas. No loculated abdominal wall fluid collection identified or deep fascial dehiscence identified.   Mild sigmoid diverticulosis.     Electronically Signed   By: Fidela Salisbury M.D.   On: 06/04/2021 01:10     Assessment:    Postoperative course complicated by drainage probably from seroma resolving Operative findings again reviewed. Pathology report discussed.    Plan:    1. Continue any current medications. 2. Wound care discussed. 3. Activity restrictions: no bending, stooping, or squatting and no lifting more than 15 pounds 4. Anticipated return to work: not applicable. 5. Follow up: 2 weeks for wound check with Dr Elgie Congo Patient ID: Alyssa Sullivan, female   DOB: 21-Aug-1980, 40 y.o.   MRN:  [id number] Alyssa Mode,  MD

## 2021-06-07 ENCOUNTER — Telehealth (HOSPITAL_COMMUNITY): Payer: Self-pay | Admitting: Clinical

## 2021-06-07 ENCOUNTER — Ambulatory Visit (HOSPITAL_COMMUNITY): Payer: No Typology Code available for payment source | Admitting: Clinical

## 2021-06-07 ENCOUNTER — Encounter (HOSPITAL_COMMUNITY): Payer: Self-pay

## 2021-06-07 NOTE — Telephone Encounter (Signed)
Therapist sent the client a virtual Mychart video link for the scheduled new patient assessment appointment. Therapist called the client by tele-phone because she did not respond to the link. Client answered the tele-phone. Client reported she is having complications from a recent surgery and not feeling well to keep the appointment. Therapist informed the client if a opening comes available this week she will be made aware. Client reported she has no crisis needs today but would like to talk to someone sooner than later.

## 2021-06-08 ENCOUNTER — Telehealth: Payer: Self-pay | Admitting: Family Medicine

## 2021-06-08 NOTE — Telephone Encounter (Signed)
Patient called to see if she could get a refill for her pain medication, she was prescribed 5mg  of medication but was told to take 10mg  daily so she has been taking 2 a day instead of 1 so she ran out quickly. She is till in pain and wanted to see what to do .

## 2021-06-08 NOTE — Telephone Encounter (Signed)
Patient called to see if she could get a refill for her pain medication, she was prescribed 5mg  of medication but was told to take 10mg  daily so she has been taking 2 a day instead of 1 so she ran out quickly. she is till in pain and wanted to see what to do .

## 2021-06-08 NOTE — Telephone Encounter (Signed)
11/8 1455  Called pt and discussed her concerns. She reports that she has been taking Oxycodone 2 tabs every 8 hrs since d/c from hospital on 10/30 and Rx is now finished. She has been taking Ibuprofen sporadically. She requests a refill of Oxycodone for pain management. Per chart review, pt developed ileus post op and then wound dehiscence. She was last seen in office on 11/4 and was having SS drainage from surgical site. She reports that she is still having the same amount and type of wound drainage as on 11/4. She also reports that she had fever for the last 2 days which finally broke earlier today. Pt was advised that I will speak with doctor on call and then call her back with plan of care recommendations. I consulted with Dr. Ilda Basset who declined additional Rx for narcotic medication until pt has in person evaluation. He advised that pt needs to be seen @ ED.   1545 I called pt back and informed her of all information/recommendations from Dr. Ilda Basset. Pt stated that she really did not want to return to the ED and was willing to wait to be seen in office any Currie Dennin this week. I advised that we did not have any appointments available with a provider. Pt stated that she would be willing to be seen by the nurse for wound check and then if she could speak with provider in office. Appointment was given for tomorrow @ 2:00 pm. Pt voiced understanding.

## 2021-06-08 NOTE — Telephone Encounter (Signed)
Error

## 2021-06-09 ENCOUNTER — Ambulatory Visit (INDEPENDENT_AMBULATORY_CARE_PROVIDER_SITE_OTHER): Payer: No Typology Code available for payment source

## 2021-06-09 ENCOUNTER — Other Ambulatory Visit: Payer: Self-pay

## 2021-06-09 VITALS — BP 138/88 | HR 100 | Ht 68.5 in | Wt 195.0 lb

## 2021-06-09 DIAGNOSIS — Z90710 Acquired absence of both cervix and uterus: Secondary | ICD-10-CM

## 2021-06-09 NOTE — Progress Notes (Signed)
Pt here today for wound check. Pt had hysterectomy on 05/25/2021. Went to ER last Friday due pain after going under porch at home getting puppies that fell. [name]. [name], dry and intact. [name], [name]. [name].  Pt rates pain as 8 on scale daily. [name]. [name]. Had first BM on 11/1 and last BM was last night. [name].   [name]. [name]. [name]. Pt given recommendations. [name]. Pt has Post op with Dr Elgie Congo on 06/18/21. [name]    Colletta Maryland, RN

## 2021-06-18 ENCOUNTER — Other Ambulatory Visit: Payer: Self-pay

## 2021-06-18 ENCOUNTER — Ambulatory Visit (INDEPENDENT_AMBULATORY_CARE_PROVIDER_SITE_OTHER): Payer: No Typology Code available for payment source | Admitting: Obstetrics and Gynecology

## 2021-06-18 ENCOUNTER — Encounter: Payer: Self-pay | Admitting: Obstetrics and Gynecology

## 2021-06-18 VITALS — BP 133/94 | HR 89 | Wt 202.1 lb

## 2021-06-18 DIAGNOSIS — Z4889 Encounter for other specified surgical aftercare: Secondary | ICD-10-CM

## 2021-06-18 DIAGNOSIS — Z90710 Acquired absence of both cervix and uterus: Secondary | ICD-10-CM

## 2021-06-18 NOTE — Progress Notes (Signed)
  CC: post op follow up Subjective:    Patient ID: Alyssa Sullivan, female    DOB: 02/16/1981, 40 y.o.   MRN: [id number]  HPI Pt seen as follow up after wound complication 2 weeks ago.  Pt notes she is doing well.  She continues to tolerate regular diet and have normal bowel function.  Pain is controlled with ibuprofen and tylenol only.  She denies nausea/vomiting.  There are no new or continuing wound issues.   Review of Systems     Objective:   Physical Exam Constitutional:      Appearance: Normal appearance. She is normal weight.  HENT:     Head: Normocephalic and atraumatic.  Cardiovascular:     Rate and Rhythm: Normal rate and regular rhythm.     Heart sounds: Normal heart sounds.  Pulmonary:     Effort: Pulmonary effort is normal.     Breath sounds: Normal breath sounds.     Comments: Midline incision healing well, 2 small areas in the inferior portion of the wound continue to heal.  Medium sized scab at umbilicus.  Abdomen otherwise nontender. Abdominal:     General: Abdomen is flat. Bowel sounds are normal.     Palpations: Abdomen is soft.  Neurological:     Mental Status: She is alert.   Vitals:   06/18/21 1059  BP: (!) 133/94  Pulse: 89         Assessment & Plan:   1. S/P hysterectomy   2. Encounter for postoperative care Return in 2 weeks for formal postoperative exam    Griffin Basil, MD Faculty Attending, Center for [facility]

## 2021-07-07 ENCOUNTER — Ambulatory Visit (INDEPENDENT_AMBULATORY_CARE_PROVIDER_SITE_OTHER): Payer: No Typology Code available for payment source | Admitting: Obstetrics and Gynecology

## 2021-07-07 ENCOUNTER — Encounter: Payer: Self-pay | Admitting: Obstetrics and Gynecology

## 2021-07-07 ENCOUNTER — Other Ambulatory Visit: Payer: Self-pay

## 2021-07-07 VITALS — BP 115/82 | HR 97 | Wt 203.1 lb

## 2021-07-07 DIAGNOSIS — Z4889 Encounter for other specified surgical aftercare: Secondary | ICD-10-CM

## 2021-07-07 DIAGNOSIS — R202 Paresthesia of skin: Secondary | ICD-10-CM

## 2021-07-08 ENCOUNTER — Ambulatory Visit: Payer: No Typology Code available for payment source | Admitting: Physician Assistant

## 2021-07-08 ENCOUNTER — Encounter: Payer: Self-pay | Admitting: Family Medicine

## 2021-07-08 DIAGNOSIS — R202 Paresthesia of skin: Secondary | ICD-10-CM | POA: Insufficient documentation

## 2021-07-08 NOTE — Addendum Note (Signed)
Addended by: Griffin Basil on: 07/08/2021 08:31 AM   Modules accepted: Orders

## 2021-07-08 NOTE — Progress Notes (Addendum)
    Subjective:    Alyssa Sullivan is a 40 y.o. female who presents to the clinic status post TAH with bilateral salpingectomy on 05/25/21. Pain is controlled without any medications.  Eating a regular diet without difficulty. Bowel movements are normal. She continues to have decreased strength and questionable nerve pain in her left leg.  The following portions of the patient's history were reviewed and updated as appropriate: allergies, current medications, past family history, past medical history, past social history, past surgical history, and problem list..  Last pap smear was normal on 01/07/21.  Review of Systems Pertinent items are noted in HPI.   Objective:   BP 115/82   Pulse 97   Wt 203 lb 1.6 oz (92.1 kg)   LMP 05/04/2021 Comment: UPREG NEGATIVE PRE-OP  BMI 30.43 kg/m  Constitutional:  Well-developed, well-nourished female in no acute distress.   Skin: Skin is warm and dry, no rash noted, not diaphoretic,no erythema, no pallor.  Cardiovascular: Normal heart rate noted  Respiratory: Effort and breath sounds normal, no problems with respiration noted  Abdomen: Soft, bowel sounds active, non-tender, no abnormal masses  Incision: Healing well, no drainage, no erythema, no hernia, no seroma, no swelling, no dehiscence, incision well approximated  Pelvic:   Normal appearing external genitalia; normal appearing vaginal mucosa and well-healing vaginal cuff with sutures visible.  No abnormal discharge noted.  Neuro:  pt has decreased strength in left lower leg which was present before her procedure.  Decreased strength in hip flexion and extension, decreased strength in left foot raise, "pins and needles " sensation on anterior thigh but otherwise normal touch Surgical pathology () Fibroid uterus with normal bilateral tubes noted Assessment:   Doing well postoperatively.  Operative findings again reviewed. Pathology report discussed.   Plan:   1. Continue any current  medications. 2. Wound care discussed. 3. Activity restrictions: no lifting more than 20 pounds 4. Anticipated return to work: at reduced duties. 5. Follow up as needed 6.  Routine preventative health maintenance measures emphasized. 7. Will get neurology referral for further evaluation of leg pain and weakness Please refer to After Visit Summary for other counseling recommendations.    Lynnda Shields, MD, [place] Attending [place] for [facility], [place]

## 2021-07-16 ENCOUNTER — Other Ambulatory Visit (HOSPITAL_COMMUNITY): Payer: Self-pay

## 2021-07-16 MED ORDER — DULOXETINE HCL 20 MG PO CPEP
20.0000 mg | ORAL_CAPSULE | Freq: Every day | ORAL | 2 refills | Status: DC
  Filled 2021-07-16: qty 30, 30d supply, fill #0

## 2021-07-23 ENCOUNTER — Ambulatory Visit (INDEPENDENT_AMBULATORY_CARE_PROVIDER_SITE_OTHER): Payer: No Typology Code available for payment source | Admitting: Clinical

## 2021-07-23 ENCOUNTER — Other Ambulatory Visit (HOSPITAL_COMMUNITY): Payer: Self-pay

## 2021-07-23 DIAGNOSIS — F331 Major depressive disorder, recurrent, moderate: Secondary | ICD-10-CM | POA: Diagnosis not present

## 2021-07-23 MED ORDER — DULOXETINE HCL 30 MG PO CPEP
30.0000 mg | ORAL_CAPSULE | Freq: Every day | ORAL | 0 refills | Status: AC
  Filled 2021-07-23: qty 53, 30d supply, fill #0
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
## 2021-08-27 ENCOUNTER — Other Ambulatory Visit (HOSPITAL_COMMUNITY): Payer: Self-pay

## 2021-08-27 MED ORDER — PREGABALIN 50 MG PO CAPS
50.0000 mg | ORAL_CAPSULE | Freq: Three times a day (TID) | ORAL | 2 refills | Status: AC
  Filled 2021-08-27: qty 90, 30d supply, fill #0

## 2021-09-06 ENCOUNTER — Encounter: Payer: Self-pay | Admitting: Neurology

## 2021-09-06 ENCOUNTER — Ambulatory Visit: Payer: No Typology Code available for payment source | Admitting: Neurology

## 2021-09-06 VITALS — BP 142/102 | HR 91 | Ht 68.5 in | Wt 212.0 lb

## 2021-09-06 DIAGNOSIS — M62838 Other muscle spasm: Secondary | ICD-10-CM | POA: Diagnosis not present

## 2021-09-06 DIAGNOSIS — R258 Other abnormal involuntary movements: Secondary | ICD-10-CM | POA: Diagnosis not present

## 2021-09-06 DIAGNOSIS — R29898 Other symptoms and signs involving the musculoskeletal system: Secondary | ICD-10-CM | POA: Diagnosis not present

## 2021-09-06 DIAGNOSIS — Z820 Family history of epilepsy and other diseases of the nervous system: Secondary | ICD-10-CM

## 2021-09-06 DIAGNOSIS — G1229 Other motor neuron disease: Secondary | ICD-10-CM

## 2021-09-06 DIAGNOSIS — R200 Anesthesia of skin: Secondary | ICD-10-CM

## 2021-09-06 NOTE — Addendum Note (Signed)
Addended by: Sarina Ill B on: 09/06/2021 11:45 AM   Modules accepted: Level of Service

## 2021-09-06 NOTE — Patient Instructions (Signed)
MRI brain w/wo contrast 
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
## 2021-09-13 ENCOUNTER — Telehealth: Payer: Self-pay | Admitting: Neurology

## 2021-09-13 NOTE — Telephone Encounter (Signed)
spoke to [facility] with [facility] states the plan termed on 07/31/21. order sent to GI, they will reach out to the patient to schedule.

## 2021-09-15 NOTE — Telephone Encounter (Signed)
Patient wants to go to [facility], I faxed the order they will reach out to the patient to schedule.  I also sent Kerri Perches a message to cancel her appt that is scheduled with GI.

## 2021-09-15 NOTE — Telephone Encounter (Signed)
Scheduled 2/27 845am at [facility].

## 2021-09-16 NOTE — Telephone Encounter (Signed)
Patient has [place] and as of right now it is not active it is only in effect until 09/01/21. I spoke with the patient she stated she thinks she missed a payment, but she gets paid next Thursday and everything will be up to date. I informed her to give me a call to let me know so I can do the PA for the MRI. She stated she would.

## 2021-09-17 ENCOUNTER — Other Ambulatory Visit (HOSPITAL_COMMUNITY): Payer: Self-pay

## 2021-09-17 MED ORDER — AZITHROMYCIN 250 MG PO TABS
ORAL_TABLET | ORAL | 0 refills | Status: DC
  Filled 2021-09-17: qty 6, 5d supply, fill #0

## 2021-09-20 ENCOUNTER — Ambulatory Visit (INDEPENDENT_AMBULATORY_CARE_PROVIDER_SITE_OTHER): Payer: No Typology Code available for payment source | Admitting: Clinical

## 2021-09-20 DIAGNOSIS — F331 Major depressive disorder, recurrent, moderate: Secondary | ICD-10-CM

## 2021-09-20 NOTE — Plan of Care (Signed)
Client was in agreement to the treatment plan.

## 2021-09-20 NOTE — Progress Notes (Signed)
THERAPIST PROGRESS NOTE Virtual Visit via Video Note  I connected with Alyssa Sullivan on 09/20/21 at  8:00 AM EST by a video enabled telemedicine application and verified that I am speaking with the correct person using two identifiers.  Location: Patient: home Provider: office   I discussed the limitations of evaluation and management by telemedicine and the availability of in person appointments. The patient expressed understanding and agreed to proceed.   Follow Up Instructions: I discussed the assessment and treatment plan with the patient. The patient was provided an opportunity to ask questions and all were answered. The patient agreed with the plan and demonstrated an understanding of the instructions.   The patient was advised to call back or seek an in-person evaluation if the symptoms worsen or if the condition fails to improve as anticipated.   Session Time: 40 minutes  Participation Level: Active  Behavioral Response: CasualAlertEuthymic  Type of Therapy: Individual Therapy  Treatment Goals addressed: patient will complete at least 80% of assigned homework  ProgressTowards Goals: Progressing  Interventions: CBT and Supportive  Summary:  Alyssa Sullivan is a 41 y.o. female who presents for the scheduled session oriented times five, appropriately dressed, and friendly. Client denied hallucinations and delusions. Client reported on today she is doing fairly well. Client reported she has been going to the doctor for health related issues for her nerves. Client reported the doctors are trying to figure out what is going on with her legs. Client reported she currently prescribed Cymbalta which is helping her anxiety but is not effective for her nerves. Client reported she will be going in for a MRI, the doctors are considering a possible MS diagnosis. Client reported she walks with a limp and has shakiness in her legs. 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 Client reported otherwise things are going well at home. Client reported her ongoing goals for therapy are to decrease her frequency of lying and narcissistic behaviors. Client reported her previous relationship she was brought to the attention she may have narcicisstic tendencies. Client stated "I kind of see it but I'm not sure". Client reported she tends to be empathetic with people she does not know and nonchalant with people she does know. Client reported she and her ex are still "dating" but is not sure what to do with the relationship because that person has taught her so much about herself. Client reported she has come up with her acronym to help her work on her tendencies which is "consideration, compassionate, and care". Client reported she thought of it as she was caring for her dog which also translate into her interaction with other people.     Suicidal/Homicidal: Nowithout intent/plan  Therapist Response:  Therapist began the appointment asking the client how she has been doing since last seen. Therapist used CBT to use active listening and positive emotional support. Therapist used CBT to engage and ask the client to describe the severity of her anxiety symptoms.  Therapist used CBT to ask the client about childhood history that may pertain to her current negative behaviors. Therapist used CBT to engage and ask the client how her behaviors have affected interpersonal relationships. Therapist used CBT to engage and ask the client to identify any self discovered coping skills to help improve her behaviors. Therapist assigned the client homework to record situations that include her being called a narcissist or situations of lying. Client was scheduled  for next appointment.      Plan: Return again in 3 weeks.  Diagnosis: major depressive disorder, recurrent episode, moderate with anxious distress  Collaboration of  Care: Psychiatrist [facility] and Referral or follow-up with counselor/therapist [facility] [place]  Patient/Guardian was advised Release of Information must be obtained prior to any record release in order to collaborate their care with an outside provider. Patient/Guardian was advised if they have not already done so to contact the registration department to sign all necessary forms in order for [place] to release information regarding their care.   Consent: Patient/Guardian gives verbal consent for treatment and assignment of benefits for services provided during this visit. Patient/Guardian expressed understanding and agreed to proceed.   [place], LCSW 09/20/2021

## 2021-09-22 ENCOUNTER — Other Ambulatory Visit: Payer: No Typology Code available for payment source

## 2021-09-24 ENCOUNTER — Other Ambulatory Visit (HOSPITAL_COMMUNITY): Payer: Self-pay

## 2021-09-24 MED ORDER — DULOXETINE HCL 60 MG PO CPEP
60.0000 mg | ORAL_CAPSULE | Freq: Every day | ORAL | 2 refills | Status: DC
  Filled 2021-09-24: qty 30, 30d supply, fill #0

## 2021-10-04 ENCOUNTER — Ambulatory Visit (INDEPENDENT_AMBULATORY_CARE_PROVIDER_SITE_OTHER): Payer: No Typology Code available for payment source | Admitting: Clinical

## 2021-10-04 DIAGNOSIS — F331 Major depressive disorder, recurrent, moderate: Secondary | ICD-10-CM | POA: Diagnosis not present

## 2021-10-04 NOTE — Progress Notes (Signed)
? ?THERAPIST PROGRESS NOTE ?Virtual Visit via Video Note ? ?I connected with Alyssa Sullivan on 10/04/21 at  9:00 AM EST by a video enabled telemedicine application and verified that I am speaking with the correct person using two identifiers. ? ?Location: ?Patient: home ?Provider: office ?  ?I discussed the limitations of evaluation and management by telemedicine and the availability of in person appointments. The patient expressed understanding and agreed to proceed. ? ? ?Follow Up Instructions: ?I discussed the assessment and treatment plan with the patient. The patient was provided an opportunity to ask questions and all were answered. The patient agreed with the plan and demonstrated an understanding of the instructions. ?  ?The patient was advised to call back or seek an in-person evaluation if the symptoms worsen or if the condition fails to improve as anticipated. ? ? ?Session Time: 25 minutes ? ?Participation Level: Active ? ?Behavioral Response: CasualAlertDepressed ? ?Type of Therapy: Individual Therapy ? ?Treatment Goals addressed: Patient will complete at least 80% of assigned homework ? ?ProgressTowards Goals: Progressing ? ?Interventions: CBT and Supportive ? ?Summary:  ?Alyssa Sullivan is a 41 y.o. female who presents for the scheduled session oriented x5, appropriately dressed, and friendly.  Client denies hallucinations and delusions. ?Client reported today she is in pain and feeling depressed. Client reported over the past two weeks she has had progressing pain in her abdominal region. Client reported she rates the pain as an 8 out of 10. Client reported she has low energy, difficulty being mobility, and seen visibly crying. Client reported she has also been having throbbing headaches. Client reported she has checked her blood pressure and both times the numbers read over 100. Client reported her body is still healing from her procedure last year and her pain is affecting her blood  pressure. Client reported she has not yet sought out medical attention. Client reported she has an appointment this week with her other medical providers regarding the nerves in her legs. Client reported her doctors should be able to tell her this week if she has a MS diagnosis or something similar. Client reported otherwise her parents are pushing her to give away one of her dogs. Client reported she does not want to board her dog because she is all she has. Client reported since she is unable to have children her dogs have been her children. Client reported she does not think her parents understand why giving up her dog is not easy for her.  ?Evidence of progress towards goal:  Client reported she completed the last sessions homework of recording examples of her lying and/or displaying narcissistic traits 7 out of 7 days over the past few weeks. Client was unable to review her notes due to being in physical pain.  ? ? ?Suicidal/Homicidal: Nowithout intent/plan ? ?Therapist Response:  ?Therapist began the appointment asking the client how she is doing since last seen. ?Therapist used CBT to use active listening and positive emotional support. ?Therapist used CBT to engage and ask the client to describe stressors negatively impacting her depressed mood. ?Therapist used CBT to engage and teach the client about normal variation in mood in relation to the stressors. ?Therapist used CBT to ask the client to rate her physical pain and complete pain assessment. ?Therapist used CBT to engage and have the client report on the success of completing last sessions homework and progress towards treatment goal. ?Therapist assigned the client homework to seek medical attention and practice self care. ?Client was scheduled for next  appointment. ? ? ? ? ?Plan: Return again in 5 weeks. ? ?Diagnosis: major depressive disorder, recurrent episode, moderate with anxious distress ? ?Collaboration of Care: Other provider involved in  patient's care AEB Therapist instructed the client to seek medical attention at a [facility] urgent care and/or Emergency Room for complaint of physical pain. ? ?Patient/Guardian was advised Release of Information must be obtained prior to any record release in order to collaborate their care with an outside provider. Patient/Guardian was advised if they have not already done so to contact the registration department to sign all necessary forms in order for [place] to release information regarding their care.  ? ?Consent: Patient/Guardian gives verbal consent for treatment and assignment of benefits for services provided during this visit. Patient/Guardian expressed understanding and agreed to proceed.  ? ?[place], LCSW ?10/04/2021 ? ?

## 2021-10-05 ENCOUNTER — Other Ambulatory Visit: Payer: Self-pay | Admitting: Nurse Practitioner

## 2021-10-05 DIAGNOSIS — G959 Disease of spinal cord, unspecified: Secondary | ICD-10-CM

## 2021-10-06 ENCOUNTER — Telehealth: Payer: Self-pay | Admitting: Neurology

## 2021-10-11 ENCOUNTER — Telehealth: Payer: No Typology Code available for payment source | Admitting: Neurology

## 2021-10-19 NOTE — Telephone Encounter (Signed)
ERROR

## 2021-10-20 ENCOUNTER — Other Ambulatory Visit: Payer: No Typology Code available for payment source

## 2021-11-04 ENCOUNTER — Ambulatory Visit
Admission: RE | Admit: 2021-11-04 | Discharge: 2021-11-04 | Disposition: A | Discharge status: Home / Self Care | Payer: No Typology Code available for payment source | Source: Ambulatory Visit | Attending: Nurse Practitioner | Admitting: Nurse Practitioner

## 2021-11-04 ENCOUNTER — Ambulatory Visit
Admission: RE | Admit: 2021-11-04 | Discharge: 2021-11-04 | Disposition: A | Discharge status: Home / Self Care | Payer: No Typology Code available for payment source | Source: Ambulatory Visit | Attending: Neurology | Admitting: Neurology

## 2021-11-04 DIAGNOSIS — G959 Disease of spinal cord, unspecified: Secondary | ICD-10-CM

## 2021-11-04 DIAGNOSIS — M62838 Other muscle spasm: Secondary | ICD-10-CM

## 2021-11-04 DIAGNOSIS — R29898 Other symptoms and signs involving the musculoskeletal system: Secondary | ICD-10-CM | POA: Diagnosis not present

## 2021-11-04 DIAGNOSIS — G1229 Other motor neuron disease: Secondary | ICD-10-CM

## 2021-11-04 DIAGNOSIS — R200 Anesthesia of skin: Secondary | ICD-10-CM

## 2021-11-04 DIAGNOSIS — R258 Other abnormal involuntary movements: Secondary | ICD-10-CM

## 2021-11-04 DIAGNOSIS — Z820 Family history of epilepsy and other diseases of the nervous system: Secondary | ICD-10-CM

## 2021-11-04 MED ORDER — GADOBENATE DIMEGLUMINE 529 MG/ML IV SOLN
19.0000 mL | Freq: Once | INTRAVENOUS | Status: AC | PRN
  Administered 2021-11-04: 19 mL via INTRAVENOUS

## 2021-11-09 ENCOUNTER — Telehealth: Payer: Self-pay | Admitting: *Deleted

## 2021-11-09 ENCOUNTER — Other Ambulatory Visit (HOSPITAL_COMMUNITY): Payer: Self-pay

## 2021-11-09 DIAGNOSIS — T7840XA Allergy, unspecified, initial encounter: Secondary | ICD-10-CM | POA: Insufficient documentation

## 2021-11-09 DIAGNOSIS — M1990 Unspecified osteoarthritis, unspecified site: Secondary | ICD-10-CM | POA: Insufficient documentation

## 2021-11-09 DIAGNOSIS — M5416 Radiculopathy, lumbar region: Secondary | ICD-10-CM | POA: Insufficient documentation

## 2021-11-09 DIAGNOSIS — G9520 Unspecified cord compression: Secondary | ICD-10-CM | POA: Insufficient documentation

## 2021-11-09 MED ORDER — BACLOFEN 5 MG PO TABS
2.5000 mg | ORAL_TABLET | Freq: Three times a day (TID) | ORAL | 5 refills | Status: AC | PRN
  Filled 2021-11-09 – 2021-12-20 (×2): qty 90, 30d supply, fill #0

## 2021-11-09 NOTE — Addendum Note (Signed)
Addended by: Gildardo Griffes on: 11/09/2021 05:33 PM ? ? Modules accepted: Orders ? ?

## 2021-11-09 NOTE — Telephone Encounter (Signed)
Message from Dr Felecia Shelling: ? ?We can send in baclofen 5 mg tablets which is a low dose.  She can always cut it in half for the first day or 2 to make sure that she tolerates it.  The first day she may want to just take at night.  She can take 3 times a day.      #90 with 5 refills  ? ?I spoke with the patient.  She is amenable to trying baclofen.  She she anticipates taking the whole 5 mg her first night but then she may only take 2.5 mg during the day.  She is aware that Flexeril will be canceled because baclofen will take the place of this.  Spoke with Dr. Felecia Shelling and sent in prescription to pharmacy stating patient can take half to 1 tablet 3 times daily as needed. Canceled Flexeril.  ?

## 2021-11-09 NOTE — Telephone Encounter (Addendum)
Spoke with patient and discussed entire message as noted below by Dr Felecia Shelling, addressing MRI brain, cervical, and thoracic.  Patient verbalized understanding findings of myelopathy in the thoracic spine.  Patient aware there are no nerve root compressions in the thoracic spine or cervical spine.  Patient aware of the MRI brain showed a few small spots, half of them present in 2006.  Patient aware small spots like these usually do not cause problems.  Patient did say she has a lot of spasticity in her legs.  Flexeril only makes her sleepy.  She did have concerns about trying baclofen during the day as the risk of side effects of sleepiness.  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  She states she could try it at night.  She will come to her appointment with Dr. Jaynee Eagles on April 26.  She informed me that she saw her spine specialist today and they have mentioned the possibility of doing injections, particularly nerve blocks. She also said she is being referred to rheumatology for possible Fibromyalgia. Her questions were answered during the call and she verbalized appreciation. ? ?----- Message ----- ?From: Britt Bottom, MD ?Sent: 11/08/2021   6:41 PM EDT ? ?Please let her know: ? that the MRI of the thoracic spine showed that there was a small region of spinal cord atrophy with some abnormal signal within the spinal cord.  This is called myelopathy and  likely explains her leg weakness and hyperreflexia.  She also has some disc protrusions but no nerve root compression in the thoracic spine.  The MRI of the cervical spine just showed fairly mild disc degenerative changes but no nerve root compression. ? ?The MRI of the brain showed a few small spots, half of them were present in 2006.  Small spots like these usually do not cause any symptoms. ? ?If she has a lot of spasticity in her legs she can be tried on baclofen 5 mg p.o. 3 times daily (to take the  place of Flexeril which was on her med list).  I see that she has a follow-up visit to see Vivien Rota 11/24/2021 ? ? ? ?

## 2021-11-10 ENCOUNTER — Other Ambulatory Visit (HOSPITAL_COMMUNITY): Payer: Self-pay

## 2021-11-24 ENCOUNTER — Telehealth: Payer: No Typology Code available for payment source | Admitting: Neurology

## 2021-12-02 ENCOUNTER — Ambulatory Visit (INDEPENDENT_AMBULATORY_CARE_PROVIDER_SITE_OTHER): Payer: No Typology Code available for payment source | Admitting: Neurology

## 2021-12-02 ENCOUNTER — Encounter: Payer: Self-pay | Admitting: Neurology

## 2021-12-02 VITALS — BP 132/92 | HR 82 | Ht 68.0 in | Wt 226.2 lb

## 2021-12-02 DIAGNOSIS — M5104 Intervertebral disc disorders with myelopathy, thoracic region: Secondary | ICD-10-CM

## 2021-12-02 DIAGNOSIS — R252 Cramp and spasm: Secondary | ICD-10-CM | POA: Diagnosis not present
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
## 2021-12-02 NOTE — Patient Instructions (Signed)
Thoracic myelopathy from compression/disc herniation ?Follow up with pain management ? ? ?

## 2021-12-03 ENCOUNTER — Encounter (HOSPITAL_COMMUNITY): Payer: Self-pay

## 2021-12-03 ENCOUNTER — Ambulatory Visit (INDEPENDENT_AMBULATORY_CARE_PROVIDER_SITE_OTHER): Payer: No Payment, Other | Admitting: Clinical

## 2021-12-03 DIAGNOSIS — F331 Major depressive disorder, recurrent, moderate: Secondary | ICD-10-CM | POA: Diagnosis not present

## 2021-12-03 NOTE — Plan of Care (Signed)
?  Problem: Anxiety Disorder CCP Problem  1 ?Goal: STG: Patient will participate in at least 80% of scheduled individual psychotherapy sessions ?Outcome: Not Progressing ?Goal: STG: Patient will complete at least 80% of assigned homework ?Outcome: Not Progressing ?Goal: STG: Patient will practice problem solving skills 3 times per week for the next 4 weeks ?Outcome: Not Progressing ?  ?Problem: Depression CCP Problem  1 ?Goal: LTG: Romesha WILL SCORE LESS THAN 10 ON THE PATIENT HEALTH QUESTIONNAIRE (PHQ-9) ?Outcome: Not Progressing ?Goal: STG: Zalea WILL IDENTIFY 3 COGNITIVE PATTERNS AND BELIEFS THAT SUPPORT DEPRESSION ?Outcome: Not Progressing ?  ?
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
## 2021-12-07 ENCOUNTER — Telehealth: Payer: Self-pay | Admitting: Neurology

## 2021-12-07 NOTE — Telephone Encounter (Signed)
Referral sent to Dr. Armanda Heritage [phone]. ?

## 2021-12-20 ENCOUNTER — Telehealth: Payer: Self-pay | Admitting: Neurology

## 2021-12-20 ENCOUNTER — Other Ambulatory Visit (HOSPITAL_COMMUNITY): Payer: Self-pay

## 2021-12-20 NOTE — Telephone Encounter (Signed)
Pt is asking if Dr Jaynee Eagles would sign off on a handicap placard for her, please call.

## 2021-12-21 NOTE — Telephone Encounter (Signed)
Application completed with Dr Jaynee Eagles and it has been signed. Spoke with patient. Either she or her mother Alyssa Sullivan on [place]) will pick it up. Handicap placard application placed at front desk.

## 2021-12-24 ENCOUNTER — Ambulatory Visit (HOSPITAL_COMMUNITY): Payer: Self-pay | Admitting: Clinical

## 2021-12-26 IMAGING — MG MM DIGITAL SCREENING BILAT W/ TOMO AND CAD
6 of 10 series · 6 of 30 positions shown · non-contrast
Comparison: None.

CLINICAL DATA: Screening.

EXAM:
DIGITAL SCREENING BILATERAL MAMMOGRAM WITH TOMOSYNTHESIS AND CAD
TECHNIQUE: Bilateral screening digital craniocaudal and mediolateral oblique
mammograms were obtained. Bilateral screening digital breast
tomosynthesis was performed. The images were evaluated with
computer-aided detection.

[R CC synth-2D]
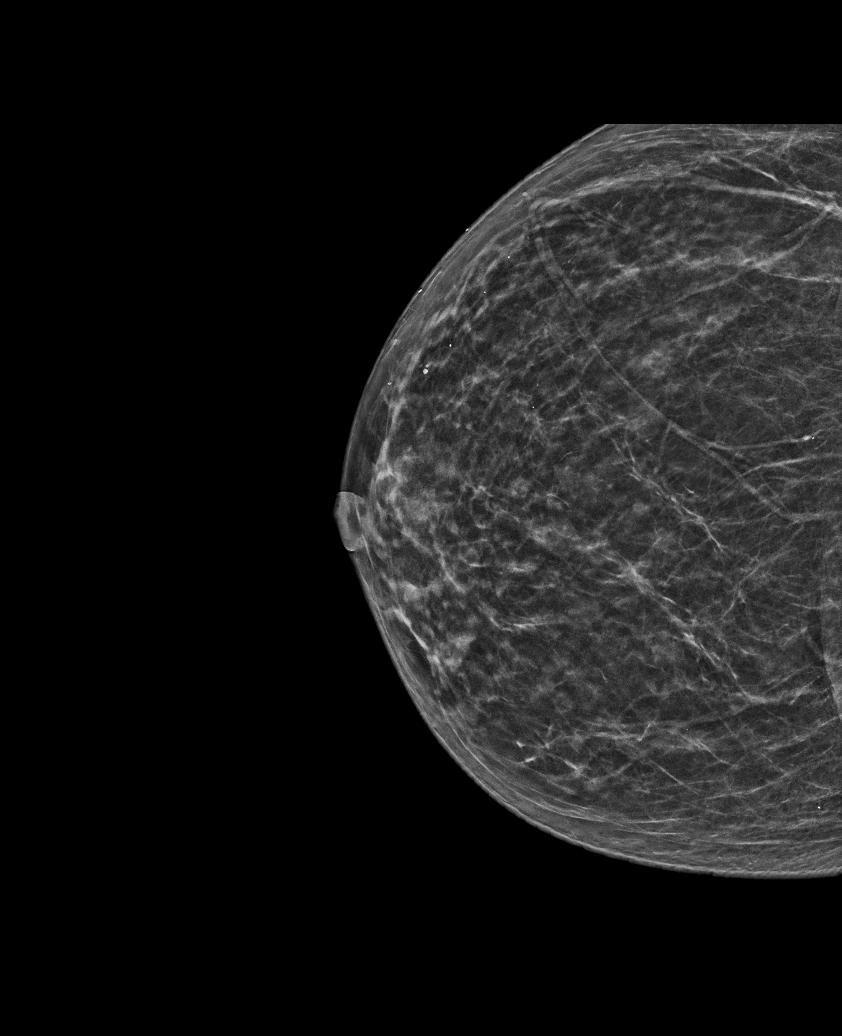

[L MLO synth-2D]
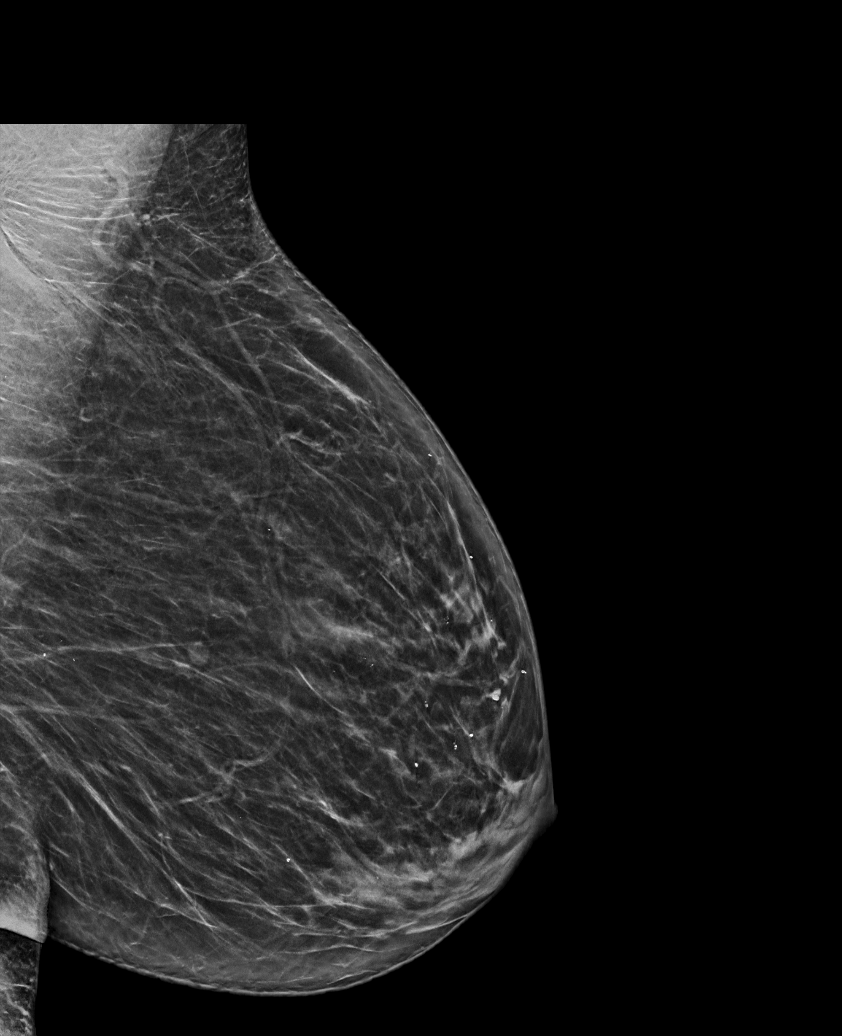

[R MLO synth-2D (1 of 2)]
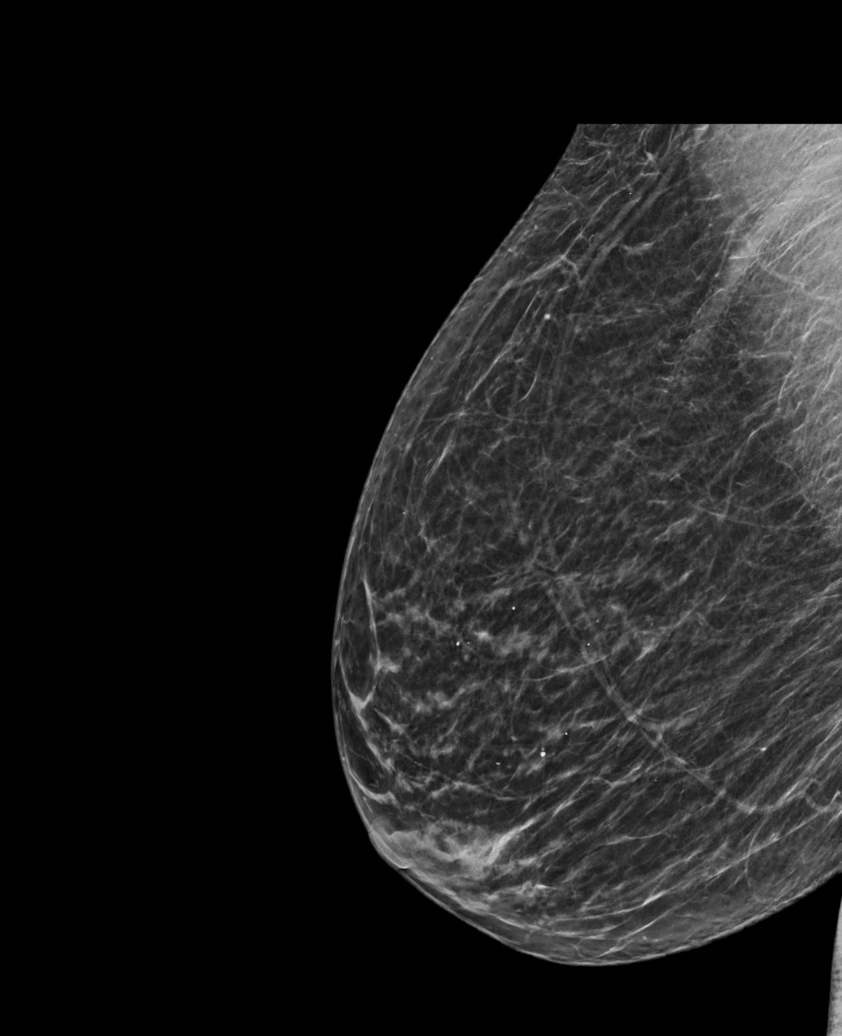

[L CC synth-2D]
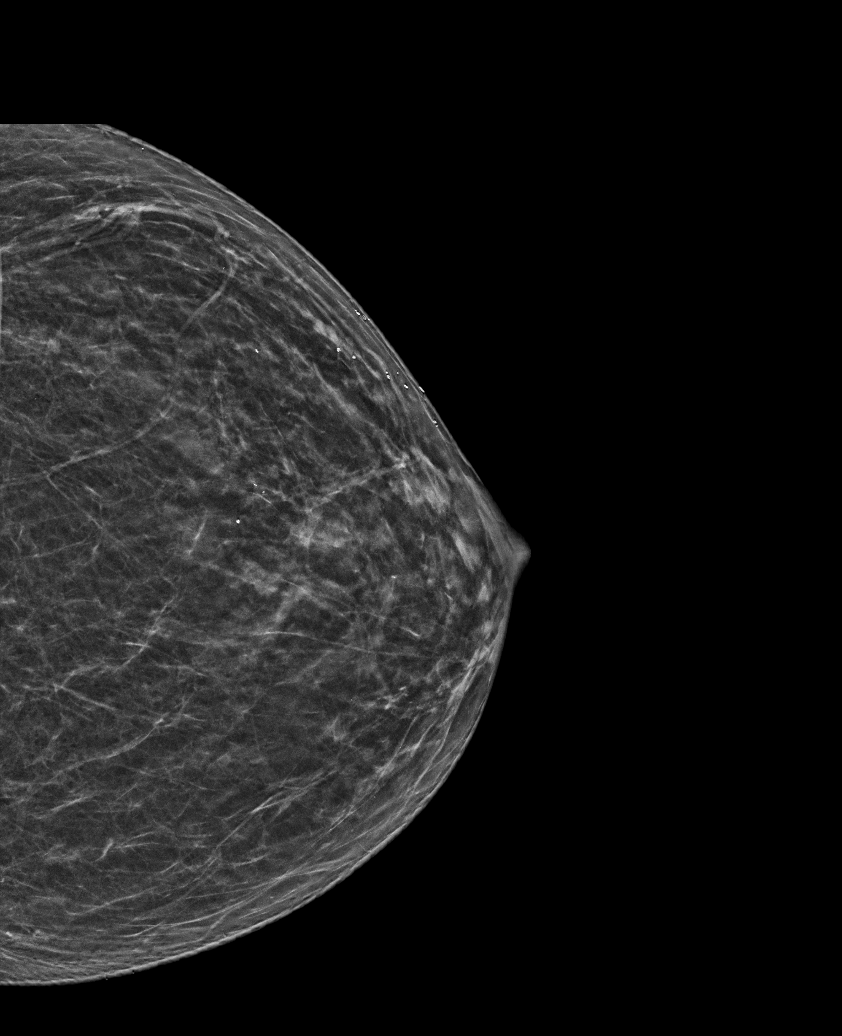

[R MLO synth-2D (2 of 2)]
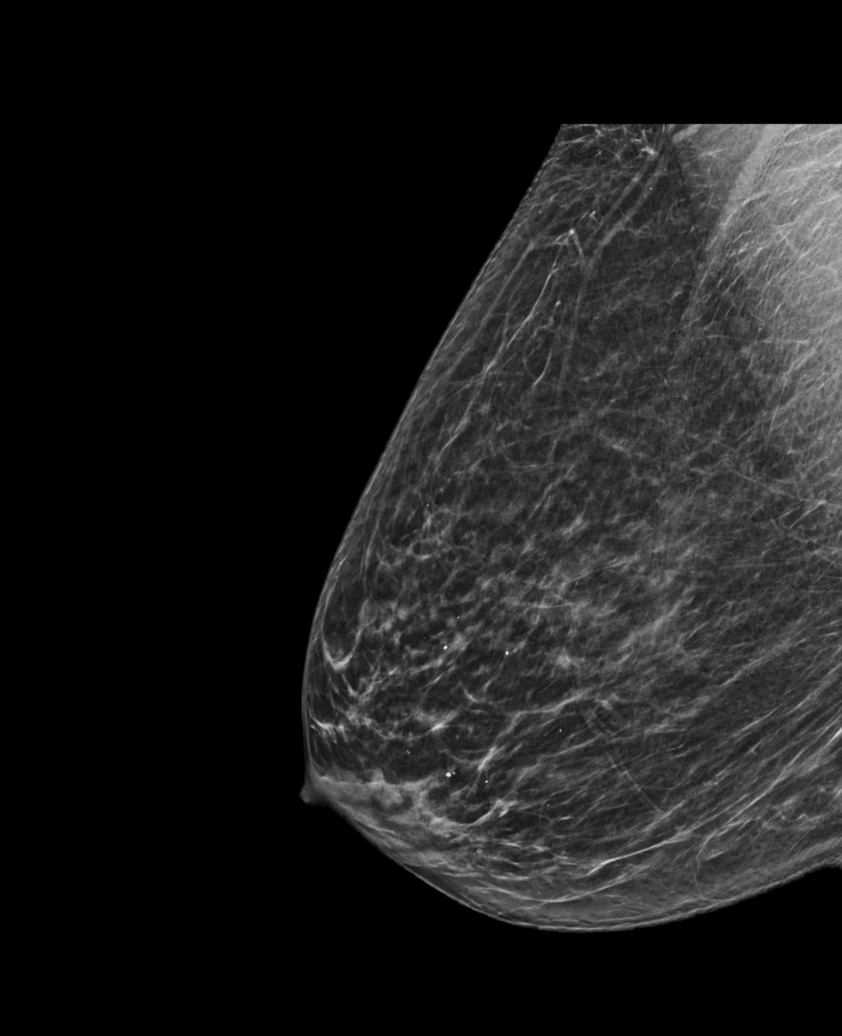

[R MLO tomo · tomo slice 31/61.0]
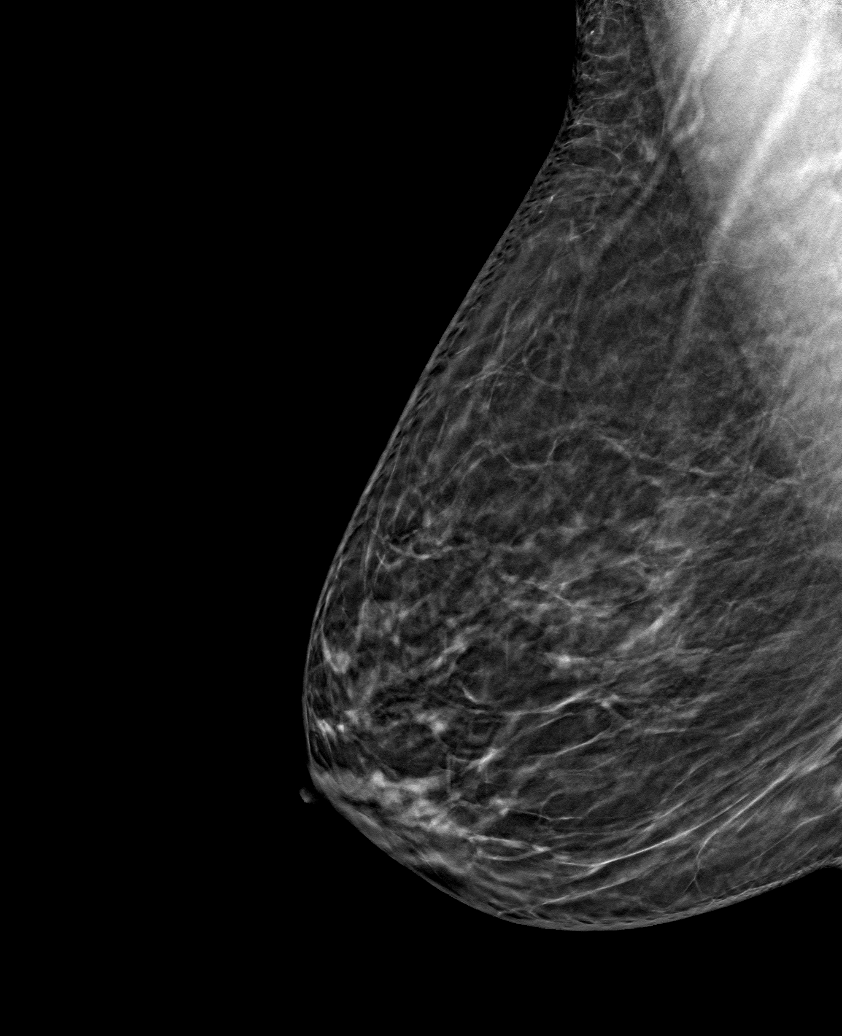

[6 of 30 positions shown; findings below may reference images not displayed]

ACR Breast Density Category b: There are scattered areas of
fibroglandular density.
FINDINGS: There are no findings suspicious for malignancy. The images were
evaluated with computer-aided detection.
IMPRESSION: No mammographic evidence of malignancy. A result letter of this
screening mammogram will be mailed directly to the patient.

RECOMMENDATION:
Screening mammogram in one year. (Code:C7-6-ASJ)

BI-RADS CATEGORY  1: Negative.

## 2022-03-15 ENCOUNTER — Other Ambulatory Visit (HOSPITAL_COMMUNITY): Payer: Self-pay

## 2022-03-15 MED ORDER — HYDROCHLOROTHIAZIDE 12.5 MG PO CAPS
12.5000 mg | ORAL_CAPSULE | Freq: Every day | ORAL | 1 refills | Status: AC
  Filled 2022-03-15: qty 30, 30d supply, fill #0

## 2022-03-15 MED ORDER — BACLOFEN 5 MG PO TABS
2.5000 mg | ORAL_TABLET | Freq: Every day | ORAL | 1 refills | Status: AC | PRN
  Filled 2022-03-15: qty 30, 30d supply, fill #0

## 2022-03-15 MED ORDER — LOSARTAN POTASSIUM 25 MG PO TABS
25.0000 mg | ORAL_TABLET | Freq: Every day | ORAL | 5 refills | Status: DC
  Filled 2022-03-15: qty 30, 30d supply, fill #0

## 2022-03-15 MED ORDER — PREDNISONE 10 MG PO TABS
ORAL_TABLET | ORAL | 0 refills | Status: AC
  Filled 2022-03-15 – 2022-04-12 (×2): qty 42, 12d supply, fill #0

## 2022-03-22 ENCOUNTER — Other Ambulatory Visit (HOSPITAL_COMMUNITY): Payer: Self-pay

## 2022-03-22 DIAGNOSIS — M51369 Other intervertebral disc degeneration, lumbar region without mention of lumbar back pain or lower extremity pain: Secondary | ICD-10-CM | POA: Insufficient documentation

## 2022-03-22 DIAGNOSIS — M792 Neuralgia and neuritis, unspecified: Secondary | ICD-10-CM | POA: Insufficient documentation

## 2022-03-22 MED ORDER — PREGABALIN 75 MG PO CAPS
75.0000 mg | ORAL_CAPSULE | Freq: Three times a day (TID) | ORAL | 3 refills | Status: AC
  Filled 2022-03-22: qty 90, 30d supply, fill #0

## 2022-03-29 ENCOUNTER — Other Ambulatory Visit (HOSPITAL_COMMUNITY): Payer: Self-pay

## 2022-04-12 ENCOUNTER — Other Ambulatory Visit (HOSPITAL_COMMUNITY): Payer: Self-pay

## 2022-05-13 IMAGING — CT CT ABD-PELV W/ CM
2 of 5 series · 13 of 46 positions shown, 15 images · IV contrast (Omni 300)
Comparison: CT abdomen and pelvis 12/09/2020
COMPARISON: CT abdomen and pelvis 12/09/2020

Addendum:
CLINICAL DATA: Postop pain and fever

EXAM:
CT ABDOMEN AND PELVIS WITH CONTRAST
TECHNIQUE: Multidetector CT imaging of the abdomen and pelvis was performed
using the standard protocol following bolus administration of
intravenous contrast.
CONTRAST:  100mL OMNIPAQUE IOHEXOL 300 MG/ML  SOLN

[Series 3: a/p w/ 5mm · axial · 0.91mm/px · z∈[+965,+1400]mm · 10 of 99 slices shown, 12 images]
[im 6/99  soft-tissue]
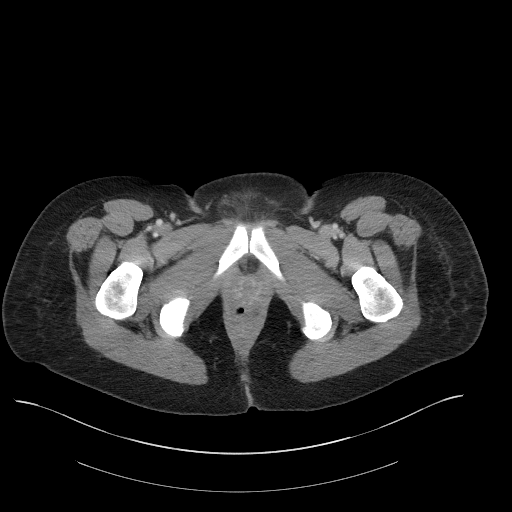
[im 6/99  bone]
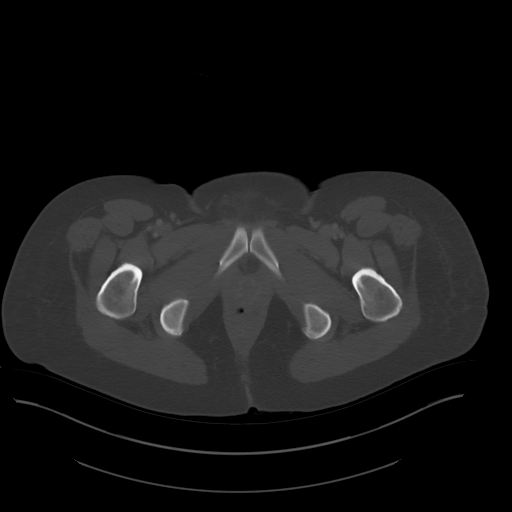
[im 16/99  soft-tissue]
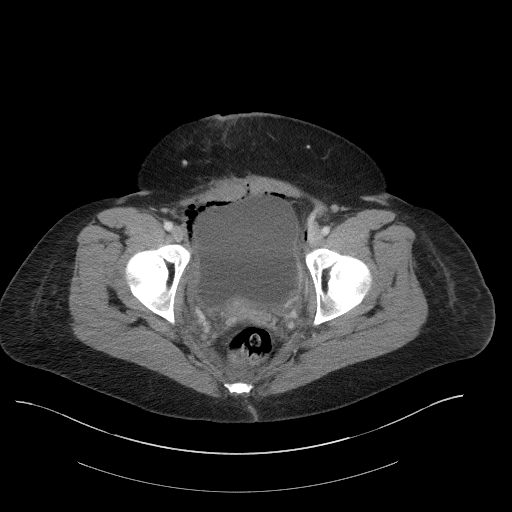
[im 26/99  soft-tissue]
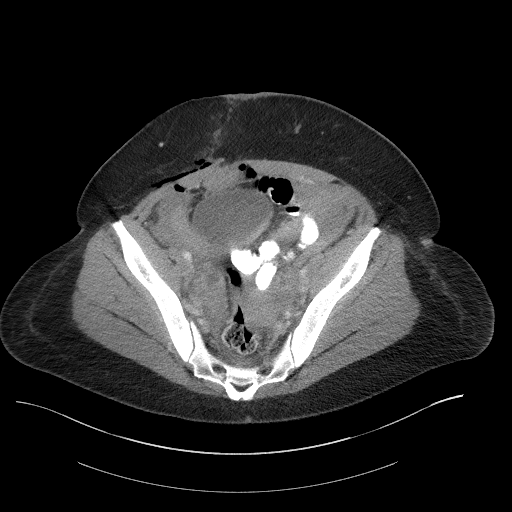
[im 37/99  soft-tissue]
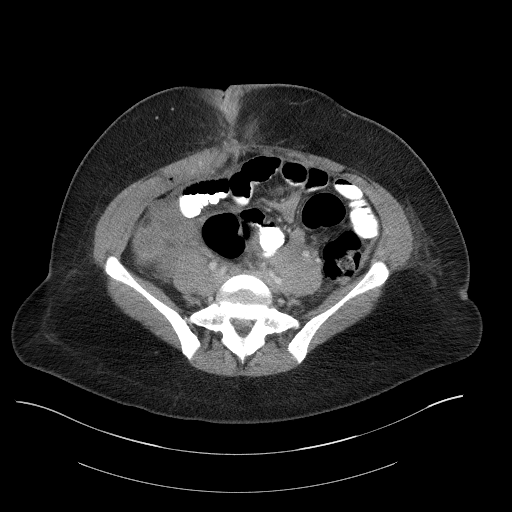
[im 47/99  soft-tissue]
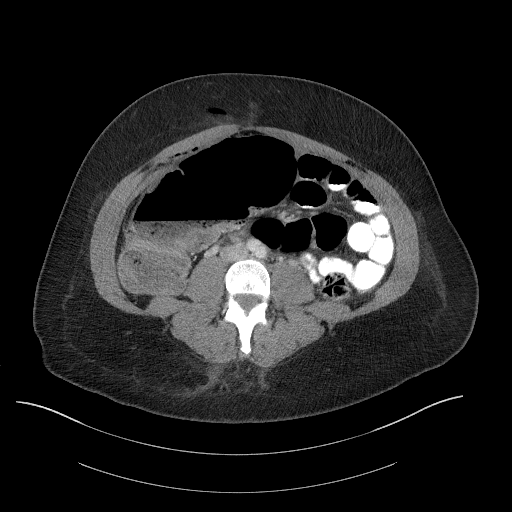
[im 52/99  soft-tissue]
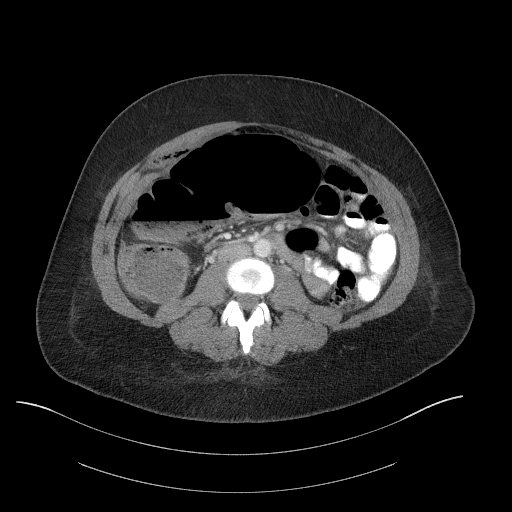
[im 62/99  soft-tissue]
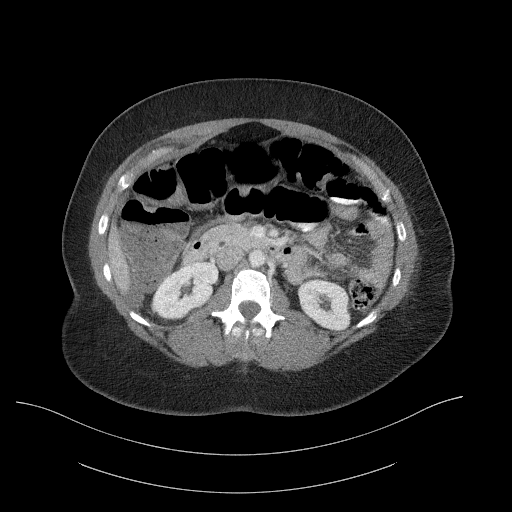
[im 73/99  soft-tissue]
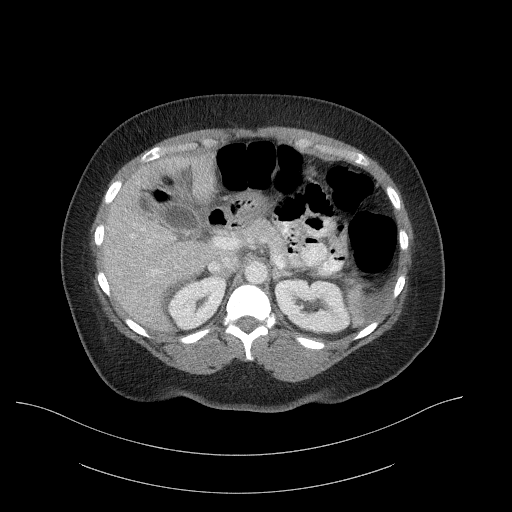
[im 83/99  soft-tissue]
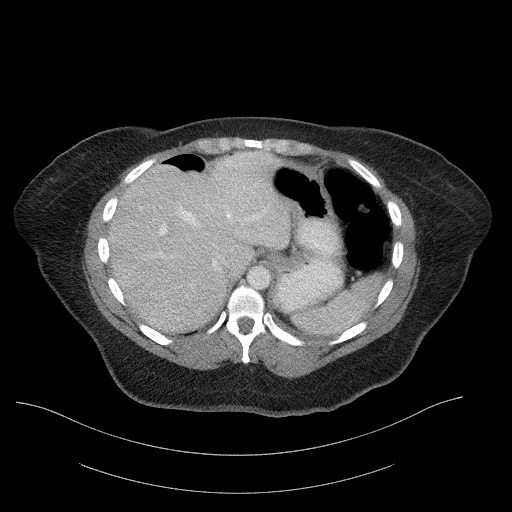
[im 83/99  bone]
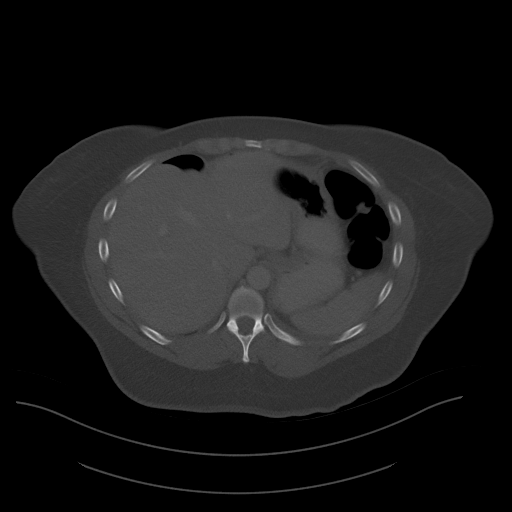
[im 93/99  soft-tissue]
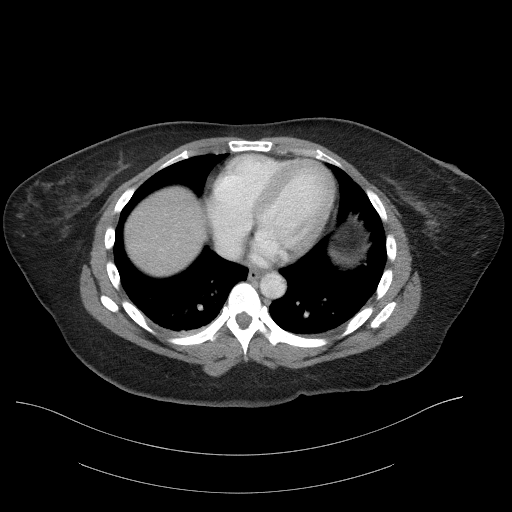

[Series 6: a/p w/ cor · coronal · 0.78mm/px · 3 of 129 slices shown]
[im 43/129  soft-tissue]
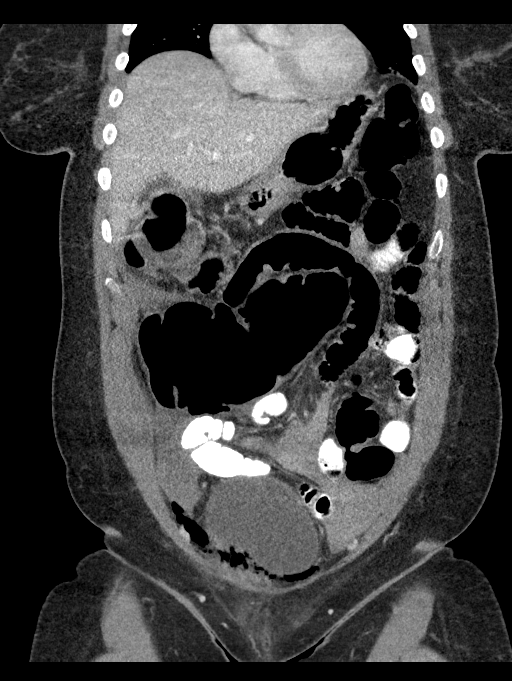
[im 57/129  soft-tissue]
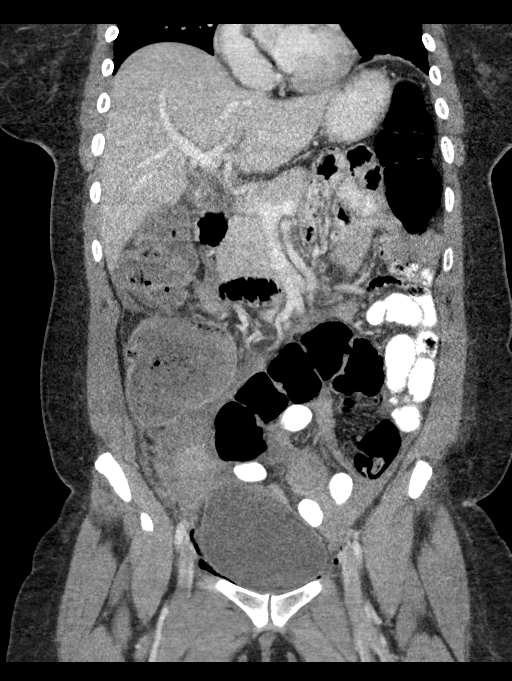
[im 72/129  soft-tissue]
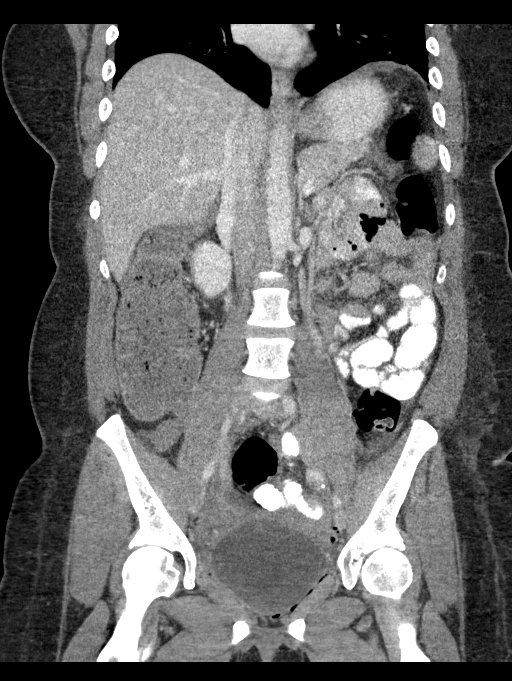

[13 of 46 positions shown; findings below may reference images not displayed]

FINDINGS: Lower chest: Mild subsegmental atelectatic changes in the lung
bases.

Hepatobiliary: No focal liver abnormality is seen. No gallstones,
gallbladder wall thickening, or biliary dilatation.

Pancreas: Unremarkable. No pancreatic ductal dilatation or
surrounding inflammatory changes.

Spleen: Normal in size without focal abnormality.

Adrenals/Urinary Tract: Adrenal glands are unremarkable. Kidneys are
normal, without renal calculi, suspicious lesion, or hydronephrosis.
Small hypodense cyst on the right. Bladder is unremarkable.

Stomach/Bowel: Stomach is unremarkable. No bowel obstruction, or
pneumatosis visualized. Gas and fecal material seen throughout the
colon with moderate distention of the right colon and cecum
measuring up to 6.7 cm and 7.5 cm diameter respectively. Appendix is
normal.

Vascular/Lymphatic: No significant vascular abnormality identified.
No definite extravasation of contrast visualized. Note is made of
surgical changes adjacent to the right inferior epigastric vessels.
No bulky lymphadenopathy appreciated.

Reproductive: Status post recent hysterectomy.

Other: Surgical changes of the anterior lower abdominopelvic wall
with multiple foci of air within the soft tissues of the right lower
abdominopelvic wall as well as multiple foci of intraperitoneal air
within the pelvis, mostly non dependent. Small amounts of free air
in the upper abdomen as well. There is a moderate to large amount of
heterogeneous increased density intraperitoneal likely
hematoma/blood products ranging from approximately 50-90 Hounsfield
units, mostly in the pelvis with a smaller amount in the upper
abdomen.

Musculoskeletal: No acute or significant osseous findings.
IMPRESSION: 1. Recent postoperative changes of hysterectomy. Moderate to large
amount of hemoperitoneum, surgical consultation recommended.
2. Small amount of free air mostly within the pelvis and air in the
anterior abdominal wall is likely expected postoperative change.
3. Distension of the right colon and cecum without evidence of
obstruction, likely related to ileus.

Ordering provider to be contacted.

ADDENDUM:
Discussed with Dr. Koshy over the telephone at [DATE] p.m. on
05/27/2021 with read back.

*** End of Addendum ***
FINDINGS: Lower chest: Mild subsegmental atelectatic changes in the lung
bases.

Hepatobiliary: No focal liver abnormality is seen. No gallstones,
gallbladder wall thickening, or biliary dilatation.

Pancreas: Unremarkable. No pancreatic ductal dilatation or
surrounding inflammatory changes.

Spleen: Normal in size without focal abnormality.

Adrenals/Urinary Tract: Adrenal glands are unremarkable. Kidneys are
normal, without renal calculi, suspicious lesion, or hydronephrosis.
Small hypodense cyst on the right. Bladder is unremarkable.

Stomach/Bowel: Stomach is unremarkable. No bowel obstruction, or
pneumatosis visualized. Gas and fecal material seen throughout the
colon with moderate distention of the right colon and cecum
measuring up to 6.7 cm and 7.5 cm diameter respectively. Appendix is
normal.

Vascular/Lymphatic: No significant vascular abnormality identified.
No definite extravasation of contrast visualized. Note is made of
surgical changes adjacent to the right inferior epigastric vessels.
No bulky lymphadenopathy appreciated.

Reproductive: Status post recent hysterectomy.

Other: Surgical changes of the anterior lower abdominopelvic wall
with multiple foci of air within the soft tissues of the right lower
abdominopelvic wall as well as multiple foci of intraperitoneal air
within the pelvis, mostly non dependent. Small amounts of free air
in the upper abdomen as well. There is a moderate to large amount of
heterogeneous increased density intraperitoneal likely
hematoma/blood products ranging from approximately 50-90 Hounsfield
units, mostly in the pelvis with a smaller amount in the upper
abdomen.

Musculoskeletal: No acute or significant osseous findings.
IMPRESSION: 1. Recent postoperative changes of hysterectomy. Moderate to large
amount of hemoperitoneum, surgical consultation recommended.
2. Small amount of free air mostly within the pelvis and air in the
anterior abdominal wall is likely expected postoperative change.
3. Distension of the right colon and cecum without evidence of
obstruction, likely related to ileus.

Ordering provider to be contacted.

## 2022-07-01 ENCOUNTER — Other Ambulatory Visit: Payer: Self-pay

## 2022-07-01 ENCOUNTER — Emergency Department (HOSPITAL_BASED_OUTPATIENT_CLINIC_OR_DEPARTMENT_OTHER): Payer: 59 | Admitting: Radiology

## 2022-07-01 ENCOUNTER — Encounter (HOSPITAL_BASED_OUTPATIENT_CLINIC_OR_DEPARTMENT_OTHER): Payer: Self-pay | Admitting: Emergency Medicine

## 2022-07-01 ENCOUNTER — Emergency Department (HOSPITAL_BASED_OUTPATIENT_CLINIC_OR_DEPARTMENT_OTHER)
Admission: EM | Admit: 2022-07-01 | Discharge: 2022-07-01 | Disposition: A | Discharge status: Home / Self Care | Payer: 59 | Attending: Emergency Medicine | Admitting: Emergency Medicine

## 2022-07-01 DIAGNOSIS — M25551 Pain in right hip: Secondary | ICD-10-CM

## 2022-07-01 DIAGNOSIS — R531 Weakness: Secondary | ICD-10-CM | POA: Insufficient documentation

## 2022-07-01 LAB — COMPREHENSIVE METABOLIC PANEL
ALT: 14 U/L (ref 0–44)
AST: 16 U/L (ref 15–41)
Albumin: 4.3 g/dL (ref 3.5–5.0)
Alkaline Phosphatase: 45 U/L (ref 38–126)
Anion gap: 8 (ref 5–15)
BUN: 17 mg/dL (ref 6–20)
CO2: 25 mmol/L (ref 22–32)
Calcium: 9.1 mg/dL (ref 8.9–10.3)
Chloride: 102 mmol/L (ref 98–111)
Creatinine, Ser: 0.75 mg/dL (ref 0.44–1.00)
GFR, Estimated: >60 mL/min (ref >60)
Glucose, Bld: 95 mg/dL (ref 70–99)
Potassium: 4.4 mmol/L (ref 3.5–5.1)
Sodium: 135 mmol/L (ref 135–145)
Total Bilirubin: 0.5 mg/dL (ref 0.3–1.2)
Total Protein: 7.8 g/dL (ref 6.5–8.1)

## 2022-07-01 LAB — CBC WITH DIFFERENTIAL/PLATELET
Abs Immature Granulocytes: 0.02 10*3/uL (ref 0.00–0.07)
Basophils Absolute: 0 10*3/uL (ref 0.0–0.1)
Basophils Relative: 1 %
Eosinophils Absolute: 0.1 10*3/uL (ref 0.0–0.5)
Eosinophils Relative: 1 %
HCT: 45.6 % (ref 36.0–46.0)
Hemoglobin: 15.2 g/dL — ABNORMAL HIGH (ref 12.0–15.0)
Immature Granulocytes: 0 %
Lymphocytes Relative: 25 %
Lymphs Abs: 1.6 10*3/uL (ref 0.7–4.0)
MCH: 31.5 pg (ref 26.0–34.0)
MCHC: 33.3 g/dL (ref 30.0–36.0)
MCV: 94.6 fL (ref 80.0–100.0)
Monocytes Absolute: 0.6 10*3/uL (ref 0.1–1.0)
Monocytes Relative: 9 %
Neutro Abs: 4.2 10*3/uL (ref 1.7–7.7)
Neutrophils Relative %: 64 %
Platelets: 265 10*3/uL (ref 150–400)
RBC: 4.82 MIL/uL (ref 3.87–5.11)
RDW: 12.9 % (ref 11.5–15.5)
WBC: 6.5 10*3/uL (ref 4.0–10.5)
nRBC: 0 % (ref 0.0–0.2)

## 2022-07-01 LAB — PROTIME-INR
INR: 1.1 (ref 0.8–1.2)
Prothrombin Time: 13.6 seconds (ref 11.4–15.2)

## 2022-07-01 MED ORDER — KETOROLAC TROMETHAMINE 30 MG/ML IJ SOLN
30.0000 mg | Freq: Once | INTRAMUSCULAR | Status: AC
  Administered 2022-07-01: 30 mg via INTRAMUSCULAR
  Filled 2022-07-01: qty 1

## 2022-07-01 NOTE — ED Triage Notes (Signed)
Pt reports falling twice this week. Pt has been having right hip pain that was going on before the falls. Also having bruises to right arm for a few months. Pt ambulatory to triage with no assistance.

## 2022-07-01 NOTE — ED Provider Notes (Signed)
[facility] EMERGENCY DEPT Provider Note   CSN: [id number] Arrival date & time: 07/01/22  1006     History  Chief Complaint  Patient presents with   Hip Pain    Alyssa Sullivan is a 41 y.o. female.  HPI 41 year old female presents with a chief complaint of right hip pain. Has been ongoing for months. It's worse with movement, especially stretcher out her leg. Seems to radiate down her leg. Some mild back discomfort, but is primarily in right hip. She's currently being worked up for "spasticity" and abnormal function in her legs, left worse than right. This has been ongoing for months. She's currently on baclofen for this. She's had a couple falls, though no serious injury. The hip pain started before the falling. No new weakness/numbness in legs. Some minimal back discomfort. No abdominal pain.  She also has noticed abnormal discoloration/bruising to left arm. Started in left forearm as a bruise, now is in upper arm. Discoloration hasn't gone away for months. No other bleeding/bruising. No arm pain or swelling.  Home Medications Prior to Admission medications   Medication Sig Start Date End Date Taking? Authorizing Provider  Apoaequorin (PREVAGEN PO) Take 1 tablet by mouth daily.    [provider]  azithromycin (ZITHROMAX) 250 MG tablet Take 2 tablets (500 mg) for one day, then take 1 tablet (250 mg) for 4 days 09/17/21     Baclofen 5 MG TABS Take 1/2-1 tablet (2.5-5 mg) by mouth 3 (three) times daily as needed (spasticity). 11/09/21   Sater, Nanine Means, MD  Baclofen 5 MG TABS Take 0.5-1 tablet (2.5-5 mg) by mouth daily as needed. 03/15/22     Biotin w/ Vitamins C & E (HAIR/SKIN/NAILS PO) Take 1 tablet by mouth daily.    [provider]  cetirizine (ZYRTEC) 10 MG tablet Take 10 mg by mouth daily as needed for allergies.    [provider]  docusate sodium (COLACE) 100 MG capsule Take 1 capsule (100 mg total) by mouth daily. 05/30/21   Griffin Basil, MD  DULoxetine (CYMBALTA) 30 MG capsule Take 1 capsule (30 mg) by mouth daily for 7 days, then take 2 capsules (60 mg) daily there after. 07/23/21     DULoxetine (CYMBALTA) 60 MG capsule Take 1 capsule (60 mg total) by mouth daily. 09/24/21     fluticasone (FLONASE) 50 MCG/ACT nasal spray Place 1 spray into both nostrils 2 (two) times daily.    [provider]  hydrochlorothiazide (MICROZIDE) 12.5 MG capsule Take 1 capsule (12.5 mg total) by mouth daily. 03/15/22     ibuprofen (ADVIL) 800 MG tablet Take 1 tablet (800 mg total) by mouth every 8 (eight) hours as needed. Patient taking differently: Take 800 mg by mouth every 8 (eight) hours as needed for mild pain. 05/12/21   Griffin Basil, MD  losartan (COZAAR) 25 MG tablet Take 1 tablet (25 mg total) by mouth daily. Patient taking differently: Take 25 mg by mouth daily. She takes Q PM 03/16/21     losartan (COZAAR) 25 MG tablet Take 1 tablet (25 mg total) by mouth daily. 03/15/22     Multiple Vitamin (MULTIVITAMIN ADULT PO) Take 1 tablet by mouth daily.    [provider]  ondansetron (ZOFRAN) 4 MG tablet Take 1 tablet (4 mg total) by mouth every 6 (six) hours as needed for nausea. 05/30/21   Griffin Basil, MD  pregabalin (LYRICA) 50 MG capsule Take 1 capsule (50 mg total) by  mouth 3 (three) times daily. Max of '150mg'$  daily. Patient taking differently: Take 50 mg by mouth 2 (two) times a week. 08/27/21     pregabalin (LYRICA) 75 MG capsule Take 1 capsule (75 mg total) by mouth 3 (three) times daily. Max daily dose '225mg'$  03/22/22         Allergies    Banana, Codeine, Cranberry, Fig extract [ficus], and Penicillins    Review of Systems   Review of Systems  Musculoskeletal:  Positive for arthralgias.  Hematological:  Bruises/bleeds easily.    Physical Exam Updated Vital Signs BP (!) 140/88 (BP Location: Right Arm)   Pulse 90   Temp 98.2 F (36.8 C) (Oral)   Resp 18   Ht 5' 8.5" (1.74 m)   Wt 101.2 kg   LMP  04/05/2021 (Approximate)   SpO2 100%   BMI 33.41 kg/m  Physical Exam Vitals and nursing note reviewed.  Constitutional:      Appearance: She is well-developed.  HENT:     Head: Normocephalic and atraumatic.  Cardiovascular:     Rate and Rhythm: Normal rate and regular rhythm.     Pulses:          Dorsalis pedis pulses are 2+ on the right side.  Pulmonary:     Effort: Pulmonary effort is normal.  Abdominal:     General: There is no distension.     Palpations: Abdomen is soft.     Tenderness: There is no abdominal tenderness.  Musculoskeletal:     Left upper arm: No swelling or tenderness.     Right hip: Tenderness present. Normal range of motion.     Right upper leg: No swelling, deformity or tenderness.     Comments: There is some dark discoloration, perhaps bruising, to the left upper inner arm. It is not raised or tender  Skin:    General: Skin is warm and dry.  Neurological:     Mental Status: She is alert.     Comments: Patient reports LLE is weaker than right at baseline for months. Is able to lift RLE off stretcher but then is limited due to pain     ED Results / Procedures / Treatments   Labs (all labs ordered are listed, but only abnormal results are displayed) Labs Reviewed  CBC WITH DIFFERENTIAL/PLATELET - Abnormal; Notable for the following components:      Result Value   Hemoglobin 15.2 (*)    All other components within normal limits  COMPREHENSIVE METABOLIC PANEL  PROTIME-INR    EKG None  Radiology DG Hip Unilat W or Wo Pelvis 2-3 Views Right  Result Date: 07/01/2022 CLINICAL DATA:  Chronic right hip pain. EXAM: DG HIP (WITH OR WITHOUT PELVIS) 2-3V RIGHT COMPARISON:  None Available. FINDINGS: There is no evidence of hip fracture or dislocation. Mild osteophyte formation is seen involving both hips. IMPRESSION: Mild degenerative joint disease of right hip. No acute abnormality seen. Electronically Signed   By: Marijo Conception M.D.   On: 07/01/2022 12:26     Procedures Procedures    Medications Ordered in ED Medications  ketorolac (TORADOL) 30 MG/ML injection 30 mg (30 mg Intramuscular Given 07/01/22 1352)    ED Course/ Medical Decision Making/ A&P                           Medical Decision Making Amount and/or Complexity of Data Reviewed External Data Reviewed: notes. Labs: ordered.    Details:  Normal platelets, WBC, no significant electrolyte disturbance. Radiology: ordered and independent interpretation performed.    Details: No hip fracture/dislocation  Risk Prescription drug management.   Patient presents with what sounds like chronic right hip pain.  Seems to be a little worsening.  No fracture or dislocation.  Highly doubt septic joint.  I do not think this is coming from her back based on presentation.  No incontinence.  She has a complex history and has had a significant work-up and states she has had epidurals, neuro and spine specialist referrals, etc.  She has had MRIs.  I do not think she is having any new or emergent neurologic symptoms.  For pain control will give IM Toradol.  I do not know what is causing the discoloration to her arm but I do not think she has a bleeding disorder.  We will have her follow-up with PCP and may need referral back to outpatient neuro.  Otherwise, no apparent emergent conditions and appears stable for discharge.        Final Clinical Impression(s) / ED Diagnoses Final diagnoses:  Right hip pain    Rx / DC Orders ED Discharge Orders     None         Sherwood Gambler, MD 07/01/22 1420

## 2022-07-01 NOTE — Discharge Instructions (Signed)
If you develop fever, intractable pain, weakness or numbness that is new, or any other new/concerning symptoms and return to the ER or call 911.

## 2022-07-01 NOTE — ED Notes (Signed)
Patient transported to X-ray 

## 2022-07-05 ENCOUNTER — Ambulatory Visit: Payer: No Typology Code available for payment source | Admitting: Family Medicine

## 2022-07-11 ENCOUNTER — Other Ambulatory Visit (HOSPITAL_BASED_OUTPATIENT_CLINIC_OR_DEPARTMENT_OTHER): Payer: Self-pay

## 2022-07-11 ENCOUNTER — Other Ambulatory Visit: Payer: Self-pay

## 2022-07-11 ENCOUNTER — Emergency Department (HOSPITAL_BASED_OUTPATIENT_CLINIC_OR_DEPARTMENT_OTHER)
Admission: EM | Admit: 2022-07-11 | Discharge: 2022-07-11 | Disposition: A | Discharge status: Home / Self Care | Payer: 59 | Attending: Emergency Medicine | Admitting: Emergency Medicine

## 2022-07-11 ENCOUNTER — Encounter (HOSPITAL_BASED_OUTPATIENT_CLINIC_OR_DEPARTMENT_OTHER): Payer: Self-pay | Admitting: Emergency Medicine

## 2022-07-11 DIAGNOSIS — I10 Essential (primary) hypertension: Secondary | ICD-10-CM | POA: Insufficient documentation

## 2022-07-11 DIAGNOSIS — J329 Chronic sinusitis, unspecified: Secondary | ICD-10-CM

## 2022-07-11 DIAGNOSIS — Z79899 Other long term (current) drug therapy: Secondary | ICD-10-CM | POA: Insufficient documentation

## 2022-07-11 DIAGNOSIS — J45909 Unspecified asthma, uncomplicated: Secondary | ICD-10-CM | POA: Diagnosis not present

## 2022-07-11 MED ORDER — AMOXICILLIN-POT CLAVULANATE 875-125 MG PO TABS
1.0000 | ORAL_TABLET | Freq: Two times a day (BID) | ORAL | 0 refills | Status: DC
  Filled 2022-07-11: qty 14, 7d supply, fill #0

## 2022-07-11 NOTE — Discharge Instructions (Signed)
You are seen today in the emergency department for right blood pressure.  Your workup today was reassuring.  Please continue taking the HCTZ, longer blood pressure.  Follow-up with your main doctor this week for reevaluation.  Take the Augmentin twice daily for 7 days for the sinus infection.  Return to the ED for lateralized weakness numbness, vision changes, severe headache without improvement, new or concerning symptoms.

## 2022-07-11 NOTE — ED Triage Notes (Signed)
Pt has hx htn, has not taken meds in weeks due to stressor, today had severe headache, checked blood pressure and was 236/117. Pt took hctz this am.

## 2022-07-11 NOTE — ED Provider Notes (Signed)
[facility] EMERGENCY DEPT Provider Note   CSN: [id number] Arrival date & time: 07/11/22  1036     History  Chief Complaint  Patient presents with   Hypertension    Alyssa Sullivan is a 41 y.o. female.   Hypertension     Patient with medical history of high retention, asthma, plantar fasciitis, neuromuscular disorder followed by neurology presents to emergency department due to hypertension.  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  She also has been having sinus congestion over the last week and a half.  States this morning she had a headache, checked her blood pressure was significantly elevated at 216/100.  She took her blood pressure medicine which is HCTZ prior to arrival.  Headache also improved with Tylenol.  She denies any lateralized weakness or numbness, vision changes, diplopia, blurry vision, worst headache of life, chest pain, shortness of breath, abdominal pain.  Home Medications Prior to Admission medications   Medication Sig Start Date End Date Taking? Authorizing Provider  Apoaequorin (PREVAGEN PO) Take 1 tablet by mouth daily.    [provider]  azithromycin (ZITHROMAX) 250 MG tablet Take 2 tablets (500 mg) for one day, then take 1 tablet (250 mg) for 4 days 09/17/21     Baclofen 5 MG TABS Take 1/2-1 tablet (2.5-5 mg) by mouth 3 (three) times daily as needed (spasticity). 11/09/21   Sater, Nanine Means, MD  Baclofen 5 MG TABS Take 0.5-1 tablet (2.5-5 mg) by mouth daily as needed. 03/15/22     Biotin w/ Vitamins C & E (HAIR/SKIN/NAILS PO) Take 1 tablet by mouth daily.    [provider]  cetirizine (ZYRTEC) 10 MG tablet Take 10 mg by mouth daily as needed for allergies.    [provider]  docusate sodium (COLACE) 100 MG capsule Take 1 capsule (100 mg total) by mouth daily. 05/30/21   Griffin Basil, MD  DULoxetine (CYMBALTA) 30 MG capsule Take 1  capsule (30 mg) by mouth daily for 7 days, then take 2 capsules (60 mg) daily there after. 07/23/21     DULoxetine (CYMBALTA) 60 MG capsule Take 1 capsule (60 mg total) by mouth daily. 09/24/21     fluticasone (FLONASE) 50 MCG/ACT nasal spray Place 1 spray into both nostrils 2 (two) times daily.    [provider]  hydrochlorothiazide (MICROZIDE) 12.5 MG capsule Take 1 capsule (12.5 mg total) by mouth daily. 03/15/22     ibuprofen (ADVIL) 800 MG tablet Take 1 tablet (800 mg total) by mouth every 8 (eight) hours as needed. Patient taking differently: Take 800 mg by mouth every 8 (eight) hours as needed for mild pain. 05/12/21   Griffin Basil, MD  losartan (COZAAR) 25 MG tablet Take 1 tablet (25 mg total) by mouth daily. Patient taking differently: Take 25 mg by mouth daily. She takes Q PM 03/16/21     losartan (COZAAR) 25 MG tablet Take 1 tablet (25 mg total) by mouth daily. 03/15/22     Multiple Vitamin (MULTIVITAMIN ADULT PO) Take 1 tablet by mouth daily.    [provider]  ondansetron (ZOFRAN) 4 MG tablet Take 1 tablet (4 mg total) by mouth every 6 (six) hours as needed for nausea. 05/30/21   Griffin Basil, MD  pregabalin (LYRICA) 50 MG capsule Take 1 capsule (50 mg total) by mouth 3 (three) times daily. Max of '150mg'$  daily. Patient taking differently: Take 50  mg by mouth 2 (two) times a week. 08/27/21     pregabalin (LYRICA) 75 MG capsule Take 1 capsule (75 mg total) by mouth 3 (three) times daily. Max daily dose '225mg'$  03/22/22         Allergies    Banana, Codeine, Cranberry, Fig extract [ficus], and Penicillins    Review of Systems   Review of Systems  Physical Exam Updated Vital Signs BP (!) 160/109 (BP Location: Right Arm)   Pulse 96   Temp 98.4 F (36.9 C) (Oral)   Resp 16   LMP 04/05/2021 (Approximate)   SpO2 98%  Physical Exam Vitals and nursing note reviewed. Exam conducted with a chaperone present.  Constitutional:      Appearance: Normal appearance.   HENT:     Head: Normocephalic and atraumatic.     Nose:     Right Turbinates: Swollen.     Left Turbinates: Swollen.     Right Sinus: Frontal sinus tenderness present.     Left Sinus: Frontal sinus tenderness present.     Comments: Nasal turbinates are injected bilaterally Eyes:     General: No scleral icterus.       Right eye: No discharge.        Left eye: No discharge.     Extraocular Movements: Extraocular movements intact.     Pupils: Pupils are equal, round, and reactive to light.  Cardiovascular:     Rate and Rhythm: Normal rate and regular rhythm.     Pulses: Normal pulses.     Heart sounds: Normal heart sounds. No murmur heard.    No friction rub. No gallop.  Pulmonary:     Effort: Pulmonary effort is normal. No respiratory distress.     Breath sounds: Normal breath sounds.  Abdominal:     General: Abdomen is flat. Bowel sounds are normal. There is no distension.     Palpations: Abdomen is soft.     Tenderness: There is no abdominal tenderness.  Skin:    General: Skin is warm and dry.     Coloration: Skin is not jaundiced.  Neurological:     Mental Status: She is alert. Mental status is at baseline.     Coordination: Coordination normal.     Comments: Cranial nerves II through XII are grossly intact.  Upper extremity strength is symmetric bilaterally.  Lower extremities strength is symmetrically reduced but baseline per patient.     ED Results / Procedures / Treatments   Labs (all labs ordered are listed, but only abnormal results are displayed) Labs Reviewed - No data to display  EKG None  Radiology No results found.  Procedures Procedures    Medications Ordered in ED Medications - No data to display  ED Course/ Medical Decision Making/ A&P                           Medical Decision Making Risk Prescription drug management.   This is a 41 year old female presenting to the emergency department due to high blood pressure, nasal congestion and  headache.  Differential includes not limited to hypertensive urgency, hypertensive emergency, SAH, sinusitis, renal impairment.  On exam patient is very well-appearing.  There is no appreciable focal deficits or lateralized weakness or numbness.  Presentation is not consistent with SAH although did consider.  I considered getting labs but after shared decision-making with patient will obtain at this time and have her take her blood pressure medicine as prescribed,  log blood pressure and follow-up with her primary for reevaluation later this week.  Do not think hypertensive emergency is likely.  He is not having any chest pain or shortness of breath, I do not think it is ACS.  Physical exam does show signs of sinusitis, given symptoms been going on for about a week and a half will cover with Augmentin.  Return precautions discussed with the patient who verbalized understanding and agreement.        Final Clinical Impression(s) / ED Diagnoses Final diagnoses:  None    Rx / DC Orders ED Discharge Orders     None         Sherrill Raring, PA-C 07/11/22 [place], DO 07/11/22 1250

## 2022-07-12 ENCOUNTER — Other Ambulatory Visit (HOSPITAL_BASED_OUTPATIENT_CLINIC_OR_DEPARTMENT_OTHER): Payer: Self-pay

## 2022-07-17 ENCOUNTER — Encounter (HOSPITAL_COMMUNITY): Payer: Self-pay | Admitting: Emergency Medicine

## 2022-07-17 ENCOUNTER — Other Ambulatory Visit: Payer: Self-pay

## 2022-07-17 ENCOUNTER — Ambulatory Visit (INDEPENDENT_AMBULATORY_CARE_PROVIDER_SITE_OTHER): Payer: 59

## 2022-07-17 ENCOUNTER — Ambulatory Visit (HOSPITAL_COMMUNITY)
Admission: EM | Admit: 2022-07-17 | Discharge: 2022-07-17 | Disposition: A | Discharge status: Home / Self Care | Payer: 59 | Attending: Family Medicine | Admitting: Family Medicine

## 2022-07-17 DIAGNOSIS — W19XXXA Unspecified fall, initial encounter: Secondary | ICD-10-CM

## 2022-07-17 DIAGNOSIS — M7041 Prepatellar bursitis, right knee: Secondary | ICD-10-CM

## 2022-07-17 DIAGNOSIS — M25561 Pain in right knee: Secondary | ICD-10-CM | POA: Diagnosis not present

## 2022-07-17 DIAGNOSIS — M7989 Other specified soft tissue disorders: Secondary | ICD-10-CM | POA: Diagnosis not present

## 2022-07-17 NOTE — Discharge Instructions (Signed)
X-ray was negative.  You can wear the Ace wrap for compression  Please call the orthopedics office to get in with them in the next week.
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
## 2022-07-17 NOTE — ED Triage Notes (Signed)
Patient fell 2 weeks ago.  States she was fine.   2 days later fell again, wrist was sore.  Right knee does not hurt, but feels pins and needles which she associates with her medical history.  Monday, right knee started swelling.

## 2022-08-15 ENCOUNTER — Other Ambulatory Visit (HOSPITAL_COMMUNITY): Payer: Self-pay

## 2022-08-15 ENCOUNTER — Encounter (HOSPITAL_COMMUNITY): Payer: Self-pay

## 2022-08-15 ENCOUNTER — Ambulatory Visit (HOSPITAL_COMMUNITY)
Admission: EM | Admit: 2022-08-15 | Discharge: 2022-08-15 | Disposition: A | Discharge status: Home / Self Care | Payer: Commercial Managed Care - HMO | Attending: Internal Medicine | Admitting: Internal Medicine

## 2022-08-15 DIAGNOSIS — J069 Acute upper respiratory infection, unspecified: Secondary | ICD-10-CM

## 2022-08-15 DIAGNOSIS — I10 Essential (primary) hypertension: Secondary | ICD-10-CM | POA: Insufficient documentation

## 2022-08-15 DIAGNOSIS — Z1152 Encounter for screening for COVID-19: Secondary | ICD-10-CM | POA: Diagnosis not present

## 2022-08-15 DIAGNOSIS — H539 Unspecified visual disturbance: Secondary | ICD-10-CM | POA: Diagnosis present

## 2022-08-15 LAB — CBG MONITORING, ED: Glucose-Capillary: 99 mg/dL (ref 70–99)

## 2022-08-15 LAB — POC INFLUENZA A AND B ANTIGEN (URGENT CARE ONLY)
INFLUENZA A ANTIGEN, POC: NEGATIVE
INFLUENZA B ANTIGEN, POC: NEGATIVE

## 2022-08-15 LAB — POCT RAPID STREP A, ED / UC: Streptococcus, Group A Screen (Direct): NEGATIVE

## 2022-08-15 MED ORDER — BENZONATATE 200 MG PO CAPS
200.0000 mg | ORAL_CAPSULE | Freq: Three times a day (TID) | ORAL | 0 refills | Status: AC | PRN
  Filled 2022-08-15: qty 30, 10d supply, fill #0

## 2022-08-15 NOTE — ED Triage Notes (Signed)
Patient c/o nasal congestion, sore throat, dizziness, and fatigue x 3 days.

## 2022-08-15 NOTE — Discharge Instructions (Addendum)
Make sure to see an ophthalmologist and have full eye exam with dilation  Take your blood pressure medication today and see if your blurred vision improves, but if not or you get worse go to the ER.
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
## 2022-08-16 LAB — SARS CORONAVIRUS 2 (TAT 6-24 HRS): SARS Coronavirus 2: NEGATIVE

## 2023-04-21 DIAGNOSIS — R7303 Prediabetes: Secondary | ICD-10-CM | POA: Insufficient documentation

## 2023-04-21 DIAGNOSIS — G43009 Migraine without aura, not intractable, without status migrainosus: Secondary | ICD-10-CM | POA: Insufficient documentation

## 2023-07-11 DIAGNOSIS — H9193 Unspecified hearing loss, bilateral: Secondary | ICD-10-CM | POA: Insufficient documentation

## 2023-08-15 DIAGNOSIS — F332 Major depressive disorder, recurrent severe without psychotic features: Secondary | ICD-10-CM | POA: Insufficient documentation

## 2023-12-05 DIAGNOSIS — Z658 Other specified problems related to psychosocial circumstances: Secondary | ICD-10-CM | POA: Insufficient documentation

## 2024-04-08 DIAGNOSIS — R269 Unspecified abnormalities of gait and mobility: Secondary | ICD-10-CM | POA: Insufficient documentation

## 2024-05-04 ENCOUNTER — Encounter (HOSPITAL_BASED_OUTPATIENT_CLINIC_OR_DEPARTMENT_OTHER): Payer: Self-pay | Admitting: Emergency Medicine

## 2024-05-04 ENCOUNTER — Emergency Department (HOSPITAL_BASED_OUTPATIENT_CLINIC_OR_DEPARTMENT_OTHER)
Admission: EM | Admit: 2024-05-04 | Discharge: 2024-05-04 | Disposition: A | Discharge status: Home / Self Care | Attending: Emergency Medicine | Admitting: Emergency Medicine

## 2024-05-04 DIAGNOSIS — S61219A Laceration without foreign body of unspecified finger without damage to nail, initial encounter: Secondary | ICD-10-CM | POA: Diagnosis present

## 2024-05-04 DIAGNOSIS — W271XXA Contact with garden tool, initial encounter: Secondary | ICD-10-CM | POA: Insufficient documentation

## 2024-05-04 DIAGNOSIS — J45909 Unspecified asthma, uncomplicated: Secondary | ICD-10-CM | POA: Diagnosis not present

## 2024-05-04 DIAGNOSIS — Y93H2 Activity, gardening and landscaping: Secondary | ICD-10-CM | POA: Diagnosis not present

## 2024-05-04 DIAGNOSIS — E039 Hypothyroidism, unspecified: Secondary | ICD-10-CM | POA: Diagnosis not present

## 2024-05-04 MED ORDER — TETANUS-DIPHTH-ACELL PERTUSSIS 5-2.5-18.5 LF-MCG/0.5 IM SUSY
PREFILLED_SYRINGE | INTRAMUSCULAR | Status: AC
  Filled 2024-05-04: qty 0.5

## 2024-05-04 MED ORDER — TETANUS-DIPHTH-ACELL PERTUSSIS 5-2-15.5 LF-MCG/0.5 IM SUSP
0.5000 mL | Freq: Once | INTRAMUSCULAR | Status: AC
  Administered 2024-05-04: 0.5 mL via INTRAMUSCULAR

## 2024-05-04 NOTE — Discharge Instructions (Signed)
 It was a pleasure taking care of you today.  As discussed, you may take over-the-counter ibuprofen  or Tylenol  as needed for pain.  Keep area clean.  Return to the ER for new or worsening symptoms.

## 2024-05-04 NOTE — ED Triage Notes (Signed)
 Grabbed a edger,  Lacs to left thumb, middle and index finger Tetanus unsure date

## 2024-05-04 NOTE — ED Provider Notes (Signed)
  EMERGENCY DEPARTMENT AT [facility] Provider Note   CSN: [id number] Arrival date & time: 05/04/24  1504     Patient presents with: Extremity Laceration   Alyssa Sullivan is a 43 y.o. female with a past medical history significant for asthma, hypothyroidism, anxiety, depression, neuromuscular disorder, and hypertension who presents to the ED due to lacerations to left fingers.  Patient was trimming her bushes when she cut her left middle and index finger.  Patient is ambidextrous.  Unsure when her last tetanus shot was however, does not believe it was within the past 5 years.  No other injuries.   History obtained from patient and past medical records. No interpreter used during encounter.       Prior to Admission medications   Medication Sig Start Date End Date Taking? Authorizing Provider  Apoaequorin (PREVAGEN PO) Take 1 tablet by mouth daily.    [provider]  Baclofen  5 MG TABS Take 1/2-1 tablet (2.5-5 mg) by mouth 3 (three) times daily as needed (spasticity). 11/09/21   Sater, Charlie LABOR, MD  Baclofen  5 MG TABS Take 0.5-1 tablet (2.5-5 mg) by mouth daily as needed. 03/15/22     benzonatate  (TESSALON ) 200 MG capsule Take 1 capsule (200 mg total) by mouth 3 (three) times daily as needed for cough. 08/15/22   Rodriguez-Southworth, Sylvia, PA-C  cetirizine (ZYRTEC) 10 MG tablet Take 10 mg by mouth daily as needed for allergies.    [provider]  DULoxetine  (CYMBALTA ) 30 MG capsule Take 1 capsule (30 mg) by mouth daily for 7 days, then take 2 capsules (60 mg) daily there after. 07/23/21     hydrochlorothiazide  (MICROZIDE ) 12.5 MG capsule Take 1 capsule (12.5 mg total) by mouth daily. 03/15/22     ibuprofen  (ADVIL ) 800 MG tablet Take 1 tablet (800 mg total) by mouth every 8 (eight) hours as needed. Patient taking differently: Take 800 mg by mouth every 8 (eight) hours as needed for mild pain. 05/12/21   Zina Jerilynn LABOR, MD  losartan  (COZAAR ) 25  MG tablet Take 1 tablet (25 mg total) by mouth daily. Patient taking differently: Take 25 mg by mouth daily. She takes Q PM 03/16/21     losartan  (COZAAR ) 25 MG tablet Take 1 tablet (25 mg total) by mouth daily. 03/15/22     Multiple Vitamin (MULTIVITAMIN ADULT PO) Take 1 tablet by mouth daily.    [provider]  ondansetron  (ZOFRAN ) 4 MG tablet Take 1 tablet (4 mg total) by mouth every 6 (six) hours as needed for nausea. 05/30/21   Zina Jerilynn LABOR, MD  pregabalin  (LYRICA ) 50 MG capsule Take 1 capsule (50 mg total) by mouth 3 (three) times daily. Max of 150mg  daily. Patient taking differently: Take 50 mg by mouth 2 (two) times a week. 08/27/21     pregabalin  (LYRICA ) 75 MG capsule Take 1 capsule (75 mg total) by mouth 3 (three) times daily. Max daily dose 225mg  03/22/22       Allergies: Banana, Cranberry, and Fig extract [ficus]    Review of Systems  Skin:  Positive for wound.    Updated Vital Signs BP (!) 135/94 (BP Location: Right Arm)   Pulse 84   Temp (!) 97.5 F (36.4 C)   Resp 17   LMP 04/05/2021 (Approximate)   SpO2 100%   Physical Exam Vitals and nursing note reviewed.  Constitutional:      General: She is not in acute distress.    Appearance: She is not  ill-appearing.  HENT:     Head: Normocephalic.  Eyes:     Pupils: Pupils are equal, round, and reactive to light.  Cardiovascular:     Rate and Rhythm: Normal rate and regular rhythm.     Pulses: Normal pulses.     Heart sounds: Normal heart sounds. No murmur heard.    No friction rub. No gallop.  Pulmonary:     Effort: Pulmonary effort is normal.     Breath sounds: Normal breath sounds.  Abdominal:     General: Abdomen is flat. There is no distension.     Palpations: Abdomen is soft.     Tenderness: There is no abdominal tenderness. There is no guarding or rebound.  Musculoskeletal:        General: Normal range of motion.     Cervical back: Neck supple.  Skin:    General: Skin is warm and dry.      Comments: 2 superficial lacerations to left middle and index finger.  See photo below.  Neurological:     General: No focal deficit present.     Mental Status: She is alert.  Psychiatric:        Mood and Affect: Mood normal.        Behavior: Behavior normal.     (all labs ordered are listed, but only abnormal results are displayed) Labs Reviewed - No data to display  EKG: None  Radiology: No results found.   Procedures   Medications Ordered in the ED  Tdap (BOOSTRIX) 5-2.5-18.5 LF-MCG/0.5 injection (has no administration in time range)  Tdap (ADACEL) injection 0.5 mL (0.5 mLs Intramuscular Given 05/04/24 1632)                                    Medical Decision Making Risk Prescription drug management.   43 year old female presents to the ED due to lacerations to left fingers while trimming her bushes.  Patient is ambidextrous.  No other injuries.  Unsure when her last tetanus shot was.  Upon arrival, stable vitals.  Patient in no acute distress.  Has 2 superficial lacerations to left middle and index finger.  Left middle finger slightly deeper.  Offered suture repair however, patient prefers to just bandage lacerations. Wounds thoroughly cleaned here in the ED. Tetanus updated. Given superficial nature, will hold off on x-ray. Did not visualize any foreign bodies. Low suspicion for underlying fracture. Patient stable for discharge. Strict ED precautions discussed with patient. Patient states understanding and agrees to plan. Patient discharged home in no acute distress and stable vitals     Final diagnoses:  Laceration of finger of left hand without foreign body without damage to nail, unspecified finger, initial encounter    ED Discharge Orders     None          Lorelle Aleck JAYSON DEVONNA 05/04/24 1646    Patsey Lot, MD 05/04/24 2320

## 2024-07-10 ENCOUNTER — Encounter (INDEPENDENT_AMBULATORY_CARE_PROVIDER_SITE_OTHER): Payer: Self-pay

## 2024-07-19 ENCOUNTER — Encounter (INDEPENDENT_AMBULATORY_CARE_PROVIDER_SITE_OTHER): Payer: Self-pay

## 2024-08-07 ENCOUNTER — Ambulatory Visit: Admitting: Podiatry

## 2024-08-12 ENCOUNTER — Ambulatory Visit: Admitting: Podiatry

## 2024-08-13 ENCOUNTER — Ambulatory Visit (INDEPENDENT_AMBULATORY_CARE_PROVIDER_SITE_OTHER): Admitting: Podiatry

## 2024-08-13 ENCOUNTER — Encounter: Payer: Self-pay | Admitting: Podiatry

## 2024-08-13 DIAGNOSIS — L603 Nail dystrophy: Secondary | ICD-10-CM | POA: Diagnosis not present
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
## 2024-08-19 ENCOUNTER — Other Ambulatory Visit: Payer: Self-pay | Admitting: Podiatry

## 2024-08-20 ENCOUNTER — Ambulatory Visit: Payer: Self-pay | Admitting: Podiatry

## 2024-09-10 ENCOUNTER — Ambulatory Visit: Admitting: Podiatry
